# Patient Record
Sex: Female | Born: 1955
Health system: Southern US, Community
[De-identification: ages and names within clinical notes are randomized; demographics above are authoritative.]

## PROBLEM LIST (undated history)

## (undated) DIAGNOSIS — G629 Polyneuropathy, unspecified: Secondary | ICD-10-CM

## (undated) DIAGNOSIS — K859 Acute pancreatitis without necrosis or infection, unspecified: Secondary | ICD-10-CM

## (undated) DIAGNOSIS — J45909 Unspecified asthma, uncomplicated: Secondary | ICD-10-CM

## (undated) DIAGNOSIS — K635 Polyp of colon: Secondary | ICD-10-CM

## (undated) DIAGNOSIS — K219 Gastro-esophageal reflux disease without esophagitis: Secondary | ICD-10-CM

## (undated) DIAGNOSIS — I10 Essential (primary) hypertension: Secondary | ICD-10-CM

## (undated) DIAGNOSIS — K589 Irritable bowel syndrome without diarrhea: Secondary | ICD-10-CM

## (undated) DIAGNOSIS — E119 Type 2 diabetes mellitus without complications: Secondary | ICD-10-CM

## (undated) DIAGNOSIS — N390 Urinary tract infection, site not specified: Secondary | ICD-10-CM

## (undated) DIAGNOSIS — F419 Anxiety disorder, unspecified: Secondary | ICD-10-CM

## (undated) DIAGNOSIS — F329 Major depressive disorder, single episode, unspecified: Secondary | ICD-10-CM

## (undated) DIAGNOSIS — M1990 Unspecified osteoarthritis, unspecified site: Secondary | ICD-10-CM

## (undated) DIAGNOSIS — N200 Calculus of kidney: Secondary | ICD-10-CM

## (undated) DIAGNOSIS — M545 Low back pain, unspecified: Secondary | ICD-10-CM

## (undated) DIAGNOSIS — F32A Depression, unspecified: Secondary | ICD-10-CM

## (undated) DIAGNOSIS — E7800 Pure hypercholesterolemia, unspecified: Secondary | ICD-10-CM

## (undated) DIAGNOSIS — M797 Fibromyalgia: Secondary | ICD-10-CM

## (undated) DIAGNOSIS — F319 Bipolar disorder, unspecified: Secondary | ICD-10-CM

## (undated) DIAGNOSIS — E039 Hypothyroidism, unspecified: Secondary | ICD-10-CM

## (undated) HISTORY — DX: Unspecified osteoarthritis, unspecified site: M19.90

## (undated) HISTORY — DX: Calculus of kidney: N20.0

## (undated) HISTORY — DX: Depression, unspecified: F32.A

## (undated) HISTORY — DX: Fibromyalgia: M79.7

## (undated) HISTORY — PX: TOOTH EXTRACTION: SUR596

## (undated) HISTORY — DX: Low back pain: M54.5

## (undated) HISTORY — DX: Gastro-esophageal reflux disease without esophagitis: K21.9

## (undated) HISTORY — PX: ABDOMINAL HYSTERECTOMY: SHX81

## (undated) HISTORY — DX: Anxiety disorder, unspecified: F41.9

## (undated) HISTORY — DX: Hypothyroidism, unspecified: E03.9

## (undated) HISTORY — DX: Type 2 diabetes mellitus without complications: E11.9

## (undated) HISTORY — DX: Unspecified asthma, uncomplicated: J45.909

## (undated) HISTORY — DX: Low back pain, unspecified: M54.50

## (undated) HISTORY — DX: Major depressive disorder, single episode, unspecified: F32.9

## (undated) HISTORY — DX: Urinary tract infection, site not specified: N39.0

## (undated) HISTORY — DX: Polyp of colon: K63.5

## (undated) HISTORY — DX: Polyneuropathy, unspecified: G62.9

## (undated) HISTORY — DX: Irritable bowel syndrome without diarrhea: K58.9

## (undated) HISTORY — DX: Essential (primary) hypertension: I10

## (undated) HISTORY — DX: Bipolar disorder, unspecified: F31.9

## (undated) HISTORY — DX: Pure hypercholesterolemia, unspecified: E78.00

## (undated) HISTORY — DX: Acute pancreatitis without necrosis or infection, unspecified: K85.90

---

## 2007-04-07 HISTORY — PX: OTHER SURGICAL HISTORY: SHX169

## 2017-03-01 DIAGNOSIS — E119 Type 2 diabetes mellitus without complications: Secondary | ICD-10-CM | POA: Insufficient documentation

## 2017-03-01 DIAGNOSIS — R110 Nausea: Secondary | ICD-10-CM | POA: Insufficient documentation

## 2017-03-01 DIAGNOSIS — R1012 Left upper quadrant pain: Secondary | ICD-10-CM | POA: Insufficient documentation

## 2017-06-14 DIAGNOSIS — E782 Mixed hyperlipidemia: Secondary | ICD-10-CM | POA: Diagnosis not present

## 2017-06-14 DIAGNOSIS — E119 Type 2 diabetes mellitus without complications: Secondary | ICD-10-CM | POA: Diagnosis not present

## 2017-06-14 DIAGNOSIS — R300 Dysuria: Secondary | ICD-10-CM | POA: Diagnosis not present

## 2017-06-14 DIAGNOSIS — K219 Gastro-esophageal reflux disease without esophagitis: Secondary | ICD-10-CM | POA: Diagnosis not present

## 2017-06-14 DIAGNOSIS — R1084 Generalized abdominal pain: Secondary | ICD-10-CM | POA: Diagnosis not present

## 2017-06-28 DIAGNOSIS — K859 Acute pancreatitis without necrosis or infection, unspecified: Secondary | ICD-10-CM | POA: Insufficient documentation

## 2017-06-28 DIAGNOSIS — K861 Other chronic pancreatitis: Secondary | ICD-10-CM | POA: Diagnosis not present

## 2017-06-28 DIAGNOSIS — R1012 Left upper quadrant pain: Secondary | ICD-10-CM | POA: Diagnosis not present

## 2017-06-30 DIAGNOSIS — E1165 Type 2 diabetes mellitus with hyperglycemia: Secondary | ICD-10-CM | POA: Diagnosis not present

## 2017-06-30 DIAGNOSIS — I10 Essential (primary) hypertension: Secondary | ICD-10-CM | POA: Diagnosis not present

## 2017-06-30 DIAGNOSIS — F329 Major depressive disorder, single episode, unspecified: Secondary | ICD-10-CM | POA: Diagnosis not present

## 2017-06-30 DIAGNOSIS — E1140 Type 2 diabetes mellitus with diabetic neuropathy, unspecified: Secondary | ICD-10-CM | POA: Diagnosis not present

## 2017-07-09 DIAGNOSIS — J9801 Acute bronchospasm: Secondary | ICD-10-CM | POA: Diagnosis not present

## 2017-07-09 DIAGNOSIS — Z6835 Body mass index (BMI) 35.0-35.9, adult: Secondary | ICD-10-CM | POA: Diagnosis not present

## 2017-07-09 DIAGNOSIS — J209 Acute bronchitis, unspecified: Secondary | ICD-10-CM | POA: Diagnosis not present

## 2017-07-21 DIAGNOSIS — F329 Major depressive disorder, single episode, unspecified: Secondary | ICD-10-CM | POA: Diagnosis not present

## 2017-07-21 DIAGNOSIS — Z6835 Body mass index (BMI) 35.0-35.9, adult: Secondary | ICD-10-CM | POA: Diagnosis not present

## 2017-07-21 DIAGNOSIS — E1165 Type 2 diabetes mellitus with hyperglycemia: Secondary | ICD-10-CM | POA: Diagnosis not present

## 2017-07-21 DIAGNOSIS — E1140 Type 2 diabetes mellitus with diabetic neuropathy, unspecified: Secondary | ICD-10-CM | POA: Diagnosis not present

## 2017-07-28 DIAGNOSIS — H9201 Otalgia, right ear: Secondary | ICD-10-CM | POA: Diagnosis not present

## 2017-07-28 DIAGNOSIS — Z6834 Body mass index (BMI) 34.0-34.9, adult: Secondary | ICD-10-CM | POA: Diagnosis not present

## 2017-07-28 DIAGNOSIS — J209 Acute bronchitis, unspecified: Secondary | ICD-10-CM | POA: Diagnosis not present

## 2017-08-02 DIAGNOSIS — Z6835 Body mass index (BMI) 35.0-35.9, adult: Secondary | ICD-10-CM | POA: Diagnosis not present

## 2017-08-02 DIAGNOSIS — E1140 Type 2 diabetes mellitus with diabetic neuropathy, unspecified: Secondary | ICD-10-CM | POA: Diagnosis not present

## 2017-08-02 DIAGNOSIS — E1165 Type 2 diabetes mellitus with hyperglycemia: Secondary | ICD-10-CM | POA: Diagnosis not present

## 2017-08-12 DIAGNOSIS — Z6834 Body mass index (BMI) 34.0-34.9, adult: Secondary | ICD-10-CM | POA: Diagnosis not present

## 2017-08-12 DIAGNOSIS — Z7189 Other specified counseling: Secondary | ICD-10-CM | POA: Diagnosis not present

## 2017-08-12 DIAGNOSIS — R05 Cough: Secondary | ICD-10-CM | POA: Diagnosis not present

## 2017-09-03 DIAGNOSIS — R1110 Vomiting, unspecified: Secondary | ICD-10-CM | POA: Diagnosis not present

## 2017-09-03 DIAGNOSIS — R197 Diarrhea, unspecified: Secondary | ICD-10-CM | POA: Diagnosis not present

## 2017-09-03 DIAGNOSIS — Z86010 Personal history of colonic polyps: Secondary | ICD-10-CM | POA: Diagnosis not present

## 2017-09-03 DIAGNOSIS — K529 Noninfective gastroenteritis and colitis, unspecified: Secondary | ICD-10-CM | POA: Diagnosis not present

## 2017-10-01 DIAGNOSIS — Z13220 Encounter for screening for lipoid disorders: Secondary | ICD-10-CM | POA: Diagnosis not present

## 2017-10-01 DIAGNOSIS — Z6833 Body mass index (BMI) 33.0-33.9, adult: Secondary | ICD-10-CM | POA: Diagnosis not present

## 2017-10-01 DIAGNOSIS — E1140 Type 2 diabetes mellitus with diabetic neuropathy, unspecified: Secondary | ICD-10-CM | POA: Diagnosis not present

## 2017-10-01 DIAGNOSIS — I10 Essential (primary) hypertension: Secondary | ICD-10-CM | POA: Diagnosis not present

## 2017-10-01 DIAGNOSIS — K529 Noninfective gastroenteritis and colitis, unspecified: Secondary | ICD-10-CM | POA: Diagnosis not present

## 2017-10-01 DIAGNOSIS — E1165 Type 2 diabetes mellitus with hyperglycemia: Secondary | ICD-10-CM | POA: Diagnosis not present

## 2017-10-11 DIAGNOSIS — G629 Polyneuropathy, unspecified: Secondary | ICD-10-CM | POA: Diagnosis not present

## 2017-10-11 DIAGNOSIS — E1165 Type 2 diabetes mellitus with hyperglycemia: Secondary | ICD-10-CM | POA: Diagnosis not present

## 2017-10-11 DIAGNOSIS — R197 Diarrhea, unspecified: Secondary | ICD-10-CM | POA: Diagnosis not present

## 2017-10-11 DIAGNOSIS — E1140 Type 2 diabetes mellitus with diabetic neuropathy, unspecified: Secondary | ICD-10-CM | POA: Diagnosis not present

## 2017-10-18 DIAGNOSIS — R1012 Left upper quadrant pain: Secondary | ICD-10-CM | POA: Diagnosis not present

## 2017-10-18 DIAGNOSIS — R748 Abnormal levels of other serum enzymes: Secondary | ICD-10-CM | POA: Diagnosis not present

## 2017-10-18 DIAGNOSIS — R159 Full incontinence of feces: Secondary | ICD-10-CM | POA: Diagnosis not present

## 2017-10-18 DIAGNOSIS — K861 Other chronic pancreatitis: Secondary | ICD-10-CM | POA: Diagnosis not present

## 2017-10-18 DIAGNOSIS — R197 Diarrhea, unspecified: Secondary | ICD-10-CM | POA: Diagnosis not present

## 2017-10-18 DIAGNOSIS — K529 Noninfective gastroenteritis and colitis, unspecified: Secondary | ICD-10-CM | POA: Diagnosis not present

## 2017-10-19 ENCOUNTER — Encounter: Payer: Self-pay | Admitting: Podiatry

## 2017-10-19 ENCOUNTER — Encounter: Payer: Self-pay | Admitting: Family Medicine

## 2017-10-19 ENCOUNTER — Ambulatory Visit (INDEPENDENT_AMBULATORY_CARE_PROVIDER_SITE_OTHER): Payer: BLUE CROSS/BLUE SHIELD

## 2017-10-19 ENCOUNTER — Ambulatory Visit (INDEPENDENT_AMBULATORY_CARE_PROVIDER_SITE_OTHER): Payer: BLUE CROSS/BLUE SHIELD | Admitting: Podiatry

## 2017-10-19 VITALS — BP 106/62 | HR 85 | Temp 98.7°F | Resp 16 | Ht 64.0 in | Wt 188.0 lb

## 2017-10-19 DIAGNOSIS — M2142 Flat foot [pes planus] (acquired), left foot: Secondary | ICD-10-CM

## 2017-10-19 DIAGNOSIS — M2141 Flat foot [pes planus] (acquired), right foot: Secondary | ICD-10-CM

## 2017-10-19 DIAGNOSIS — G5763 Lesion of plantar nerve, bilateral lower limbs: Secondary | ICD-10-CM | POA: Diagnosis not present

## 2017-10-19 DIAGNOSIS — M722 Plantar fascial fibromatosis: Secondary | ICD-10-CM | POA: Diagnosis not present

## 2017-10-19 DIAGNOSIS — S92515A Nondisplaced fracture of proximal phalanx of left lesser toe(s), initial encounter for closed fracture: Secondary | ICD-10-CM | POA: Diagnosis not present

## 2017-10-19 NOTE — Progress Notes (Signed)
   Subjective:    Patient ID: Chelsea Mcbride, female    DOB: Apr 09, 1955, 62 y.o.   MRN: [id number]  HPI    Review of Systems  Musculoskeletal: Positive for arthralgias, joint swelling and myalgias.  All other systems reviewed and are negative.      Objective:   Physical Exam        Assessment & Plan:

## 2017-10-19 NOTE — Progress Notes (Signed)
  Subjective:  Patient ID: Chelsea Mcbride, female    DOB: [birth year],  MRN: [id number]  Chief Complaint  Patient presents with  . Foot Pain    B/L plantar forefoot (1-5th) x 6 mo; 10/10 sharp pain Tx; tylenol and ibuprofen -pt been dx w/ neuropathy x 1 year ago  . Foot Injury    L 4th toe (kicked door) x 1 mo; very painful red and swollen     62 y.o. female presents with the above complaint.  Reports pain to the balls of both feet for 6 months.  10 out of 10 sharp pain.  Has tried Tylenol and ibuprofen.  Diagnosed with neuropathy a year ago but is not getting treatment for.  Also reports injury to left fourth toe states that she kicked a door last month.  States that it is very painful and red and swollen. No past medical history on file.   Current Outpatient Medications:  .  atorvastatin (LIPITOR) 10 MG tablet, Take 10 mg by mouth daily., Disp: , Rfl:  .  citalopram (CELEXA) 40 MG tablet, Take 40 mg by mouth daily., Disp: , Rfl:  .  Dulaglutide (TRULICITY) 0.75 MG/0.5ML SOPN, Inject into the skin., Disp: , Rfl:  .  Eluxadoline (VIBERZI) 75 MG TABS, Take by mouth., Disp: , Rfl:  .  empagliflozin (JARDIANCE) 25 MG TABS tablet, Take 25 mg by mouth daily., Disp: , Rfl:  .  gabapentin (NEURONTIN) 100 MG capsule, Take 100 mg by mouth 3 (three) times daily., Disp: , Rfl:  .  lisinopril (PRINIVIL,ZESTRIL) 10 MG tablet, Take 10 mg by mouth daily., Disp: , Rfl:   Allergies  Allergen Reactions  . Penicillins Hives   Review of Systems: Negative except as noted in the HPI. Denies N/V/F/Ch. Objective:   Vitals:   10/19/17 0938  BP: 106/62  Pulse: 85  Resp: 16  Temp: 98.7 F (37.1 C)   General AA&O x3. Normal mood and affect.  Vascular Dorsalis pedis and posterior tibial pulses  present 2+ bilaterally  Capillary refill normal to all digits. Pedal hair growth normal.  Neurologic Epicritic sensation grossly present.  Dermatologic No open lesions. Interspaces clear of maceration. Nails  well groomed and normal in appearance.  Orthopedic: MMT 5/5 in dorsiflexion, plantarflexion, inversion, and eversion. Normal joint ROM without pain or crepitus. About the left fourth proximal phalanx.  Edema noted to the digit.  Slight contusion.  Pain palpation about the third interspace bilateral with Mulder's click.  Pain palpation dorsally and plantarly of the interspace.   Assessment & Plan:  Patient was evaluated and treated and all questions answered.  Morton Neuroma bilat -Pads dispensed. -Injection bilat as below. -Casted for CMOS  Procedure: Neuroma Injection Location: Bilateral 3rd interspace Skin Prep: Alcohol. Injectate: 0.5 cc 0.5% marcaine plain, 0.5 cc dexamethasone phosphate. Disposition: Patient tolerated procedure well. Injection site dressed with a band-aid.  L 4th Toe Fracture -XR taken. Lucency across proximal phalanx base suspicious for fx -Buddy taped to the 3rd toe. Educated on taping.  Return in about 3 weeks (around 11/09/2017).

## 2017-10-20 ENCOUNTER — Telehealth: Payer: Self-pay | Admitting: Podiatry

## 2017-10-20 NOTE — Telephone Encounter (Signed)
I saw Dr. Samuella CotaPrice yesterday and he gave me some injections in my feet because of a nerve that was really bothering me. They are still kind of stinging and stuff today. I wondered if they should have been calmed down by now? If I could get a call back at [phone]. Thank you.

## 2017-10-20 NOTE — Telephone Encounter (Signed)
I told pt it may take a series of injections to get the nerve calmed down or lysed depending on the injection and on occasions there may be a flare to the nerves, which she could treat with ice therapy 15 minute/session 3-4 times daily protecting the skin with a light cloth. Pt states understanding.

## 2017-10-22 DIAGNOSIS — G4733 Obstructive sleep apnea (adult) (pediatric): Secondary | ICD-10-CM | POA: Diagnosis not present

## 2017-10-22 DIAGNOSIS — I10 Essential (primary) hypertension: Secondary | ICD-10-CM | POA: Diagnosis not present

## 2017-10-22 DIAGNOSIS — E1165 Type 2 diabetes mellitus with hyperglycemia: Secondary | ICD-10-CM | POA: Diagnosis not present

## 2017-10-22 DIAGNOSIS — E1140 Type 2 diabetes mellitus with diabetic neuropathy, unspecified: Secondary | ICD-10-CM | POA: Diagnosis not present

## 2017-10-27 ENCOUNTER — Other Ambulatory Visit: Payer: Self-pay | Admitting: Podiatry

## 2017-10-27 DIAGNOSIS — S92515A Nondisplaced fracture of proximal phalanx of left lesser toe(s), initial encounter for closed fracture: Secondary | ICD-10-CM

## 2017-10-27 DIAGNOSIS — G5763 Lesion of plantar nerve, bilateral lower limbs: Secondary | ICD-10-CM

## 2017-10-27 DIAGNOSIS — M2142 Flat foot [pes planus] (acquired), left foot: Secondary | ICD-10-CM

## 2017-10-27 DIAGNOSIS — M722 Plantar fascial fibromatosis: Secondary | ICD-10-CM

## 2017-10-27 DIAGNOSIS — M2141 Flat foot [pes planus] (acquired), right foot: Secondary | ICD-10-CM

## 2017-11-03 DIAGNOSIS — Z6833 Body mass index (BMI) 33.0-33.9, adult: Secondary | ICD-10-CM | POA: Diagnosis not present

## 2017-11-03 DIAGNOSIS — E669 Obesity, unspecified: Secondary | ICD-10-CM | POA: Diagnosis not present

## 2017-11-03 DIAGNOSIS — S46912A Strain of unspecified muscle, fascia and tendon at shoulder and upper arm level, left arm, initial encounter: Secondary | ICD-10-CM | POA: Diagnosis not present

## 2017-11-05 DIAGNOSIS — R221 Localized swelling, mass and lump, neck: Secondary | ICD-10-CM | POA: Diagnosis not present

## 2017-11-05 DIAGNOSIS — R0981 Nasal congestion: Secondary | ICD-10-CM | POA: Diagnosis not present

## 2017-11-05 DIAGNOSIS — Z8781 Personal history of (healed) traumatic fracture: Secondary | ICD-10-CM | POA: Diagnosis not present

## 2017-11-09 ENCOUNTER — Ambulatory Visit (INDEPENDENT_AMBULATORY_CARE_PROVIDER_SITE_OTHER): Payer: BLUE CROSS/BLUE SHIELD | Admitting: Podiatry

## 2017-11-09 DIAGNOSIS — G5763 Lesion of plantar nerve, bilateral lower limbs: Secondary | ICD-10-CM

## 2017-11-09 NOTE — Progress Notes (Signed)
  Subjective:  Patient ID: Chelsea Mcbride, female    DOB: 04/21/1955,  MRN: [id number]  Chief Complaint  Patient presents with  . Neuroma    F/U B/L neuroma Pt.stated," no improvement, it's still sore and swollen; 5/10." Tx: tapping    62 y.o. female presents with the above complaint.  Injection helped but pain returned. Pads have been helping.  Also taping the toes as directed and states they help.  No past medical history on file.   Current Outpatient Medications:  .  atorvastatin (LIPITOR) 10 MG tablet, Take 10 mg by mouth daily., Disp: , Rfl:  .  citalopram (CELEXA) 40 MG tablet, Take 40 mg by mouth daily., Disp: , Rfl:  .  Dulaglutide (TRULICITY) 0.75 MG/0.5ML SOPN, Inject into the skin., Disp: , Rfl:  .  Eluxadoline (VIBERZI) 75 MG TABS, Take by mouth., Disp: , Rfl:  .  empagliflozin (JARDIANCE) 25 MG TABS tablet, Take 25 mg by mouth daily., Disp: , Rfl:  .  gabapentin (NEURONTIN) 100 MG capsule, Take 100 mg by mouth 3 (three) times daily., Disp: , Rfl:  .  lisinopril (PRINIVIL,ZESTRIL) 10 MG tablet, Take 10 mg by mouth daily., Disp: , Rfl:   Allergies  Allergen Reactions  . Penicillins Hives   Review of Systems: Negative except as noted in the HPI. Denies N/V/F/Ch. Objective:   There were no vitals filed for this visit. General AA&O x3. Normal mood and affect.  Vascular Dorsalis pedis and posterior tibial pulses  present 2+ bilaterally  Capillary refill normal to all digits. Pedal hair growth normal.  Neurologic Epicritic sensation grossly present.  Dermatologic No open lesions. Interspaces clear of maceration. Nails well groomed and normal in appearance.  Orthopedic: MMT 5/5 in dorsiflexion, plantarflexion, inversion, and eversion. Normal joint ROM without pain or crepitus. POP about the left fourth proximal phalanx.   Pain palpation about the third interspace bilateral with Mulder's click.  Pain palpation dorsally and plantarly of the interspace.   Assessment &  Plan:  Patient was evaluated and treated and all questions answered.  Morton Neuroma bilat -Pads dispensed. -Injection #2 bilat as below. -CMO molds sent for fabrication.  Procedure: Neuroma Injection Location: Bilateral 3rd interspace Skin Prep: Alcohol. Injectate: 0.5 cc 0.5% marcaine plain, 0.5 cc dexamethasone phosphate. Disposition: Patient tolerated procedure well. Injection site dressed with a band-aid.  L 4th Toe Fracture -Continue taping.  No follow-ups on file.

## 2017-11-15 ENCOUNTER — Ambulatory Visit (INDEPENDENT_AMBULATORY_CARE_PROVIDER_SITE_OTHER): Payer: Self-pay | Admitting: Podiatry

## 2017-11-15 ENCOUNTER — Ambulatory Visit (INDEPENDENT_AMBULATORY_CARE_PROVIDER_SITE_OTHER): Payer: BLUE CROSS/BLUE SHIELD

## 2017-11-15 DIAGNOSIS — G629 Polyneuropathy, unspecified: Secondary | ICD-10-CM

## 2017-11-15 DIAGNOSIS — M7752 Other enthesopathy of left foot: Secondary | ICD-10-CM

## 2017-11-15 DIAGNOSIS — M779 Enthesopathy, unspecified: Secondary | ICD-10-CM

## 2017-11-15 DIAGNOSIS — E1142 Type 2 diabetes mellitus with diabetic polyneuropathy: Secondary | ICD-10-CM | POA: Diagnosis not present

## 2017-11-15 DIAGNOSIS — S92515A Nondisplaced fracture of proximal phalanx of left lesser toe(s), initial encounter for closed fracture: Secondary | ICD-10-CM

## 2017-11-15 DIAGNOSIS — M7751 Other enthesopathy of right foot: Secondary | ICD-10-CM | POA: Diagnosis not present

## 2017-11-15 DIAGNOSIS — M792 Neuralgia and neuritis, unspecified: Secondary | ICD-10-CM

## 2017-11-15 MED ORDER — MELOXICAM 15 MG PO TABS: 15.0000 mg | ORAL_TABLET | Freq: Every day | ORAL | 0 refills | Status: DC

## 2017-11-15 MED ORDER — GABAPENTIN 300 MG PO CAPS: 300.0000 mg | ORAL_CAPSULE | Freq: Every day | ORAL | 0 refills | Status: AC

## 2017-11-15 NOTE — Progress Notes (Signed)
  Subjective:  Patient ID: Chelsea Mcbride, female    DOB: Mar 31, 1956,  MRN: [id number]  Chief Complaint  Patient presents with  . Foot Injury    F/U L 4th toe fx Pt. stated," it's not doing any better, I dropped another box on my toe and now it's throbbing, also my L 3rd toe has a knot."   . Foot Pain    R foot lateral side foot pain and a knot x 2 days; 10/10 sharp pain -no injury Tx: Ibuprofen    62 y.o. female presents with the above complaint.  Injection helped but pain returned. Pads have been helping.  Also taping the toes as directed and states they help.  No past medical history on file.   Current Outpatient Medications:  .  atorvastatin (LIPITOR) 10 MG tablet, Take 10 mg by mouth daily., Disp: , Rfl:  .  citalopram (CELEXA) 40 MG tablet, Take 40 mg by mouth daily., Disp: , Rfl:  .  Dulaglutide (TRULICITY) 0.75 MG/0.5ML SOPN, Inject into the skin., Disp: , Rfl:  .  Eluxadoline (VIBERZI) 75 MG TABS, Take by mouth., Disp: , Rfl:  .  empagliflozin (JARDIANCE) 25 MG TABS tablet, Take 25 mg by mouth daily., Disp: , Rfl:  .  gabapentin (NEURONTIN) 100 MG capsule, Take 100 mg by mouth 3 (three) times daily., Disp: , Rfl:  .  lisinopril (PRINIVIL,ZESTRIL) 10 MG tablet, Take 10 mg by mouth daily., Disp: , Rfl:  .  gabapentin (NEURONTIN) 300 MG capsule, Take 1 capsule (300 mg total) by mouth at bedtime., Disp: 30 capsule, Rfl: 0 .  meloxicam (MOBIC) 15 MG tablet, Take 1 tablet (15 mg total) by mouth daily., Disp: 30 tablet, Rfl: 0  Allergies  Allergen Reactions  . Penicillins Hives   Review of Systems: Negative except as noted in the HPI. Denies N/V/F/Ch. Objective:   There were no vitals filed for this visit. General AA&O x3. Normal mood and affect.  Vascular Dorsalis pedis and posterior tibial pulses  present 2+ bilaterally  Capillary refill normal to all digits. Pedal hair growth normal.  Neurologic Epicritic sensation grossly present.  Dermatologic No open  lesions. Interspaces clear of maceration. Nails well groomed and normal in appearance.  Orthopedic: MMT 5/5 in dorsiflexion, plantarflexion, inversion, and eversion. Normal joint ROM without pain or crepitus. POP about the left fourth proximal phalanx.   Pain palpation about the left third proximal phalanx Pain palpation about the third interspace bilateral with Mulder's click.  Pain palpation dorsally and plantarly of the interspace.   Assessment & Plan:  Patient was evaluated and treated and all questions answered.  L 4th Toe Fracture -Continue taping.  L 3rd Toe Fracture -X-rays taken reviewed slight cortical irregularity seen on the oblique view.  Continue taping.  No follow-ups on file.

## 2017-11-22 ENCOUNTER — Other Ambulatory Visit: Payer: Self-pay | Admitting: Podiatry

## 2017-11-22 DIAGNOSIS — M779 Enthesopathy, unspecified: Secondary | ICD-10-CM

## 2017-11-22 DIAGNOSIS — G629 Polyneuropathy, unspecified: Secondary | ICD-10-CM

## 2017-11-22 DIAGNOSIS — M792 Neuralgia and neuritis, unspecified: Secondary | ICD-10-CM

## 2017-11-22 DIAGNOSIS — S92515A Nondisplaced fracture of proximal phalanx of left lesser toe(s), initial encounter for closed fracture: Secondary | ICD-10-CM

## 2017-12-07 ENCOUNTER — Ambulatory Visit (INDEPENDENT_AMBULATORY_CARE_PROVIDER_SITE_OTHER): Payer: BLUE CROSS/BLUE SHIELD | Admitting: Podiatry

## 2017-12-07 ENCOUNTER — Ambulatory Visit (INDEPENDENT_AMBULATORY_CARE_PROVIDER_SITE_OTHER): Payer: BLUE CROSS/BLUE SHIELD

## 2017-12-07 DIAGNOSIS — M779 Enthesopathy, unspecified: Secondary | ICD-10-CM

## 2017-12-07 DIAGNOSIS — S92515A Nondisplaced fracture of proximal phalanx of left lesser toe(s), initial encounter for closed fracture: Secondary | ICD-10-CM | POA: Diagnosis not present

## 2017-12-07 DIAGNOSIS — M205X9 Other deformities of toe(s) (acquired), unspecified foot: Secondary | ICD-10-CM | POA: Diagnosis not present

## 2017-12-07 DIAGNOSIS — M2042 Other hammer toe(s) (acquired), left foot: Secondary | ICD-10-CM | POA: Diagnosis not present

## 2017-12-07 DIAGNOSIS — M792 Neuralgia and neuritis, unspecified: Secondary | ICD-10-CM | POA: Diagnosis not present

## 2017-12-07 DIAGNOSIS — G5763 Lesion of plantar nerve, bilateral lower limbs: Secondary | ICD-10-CM

## 2017-12-07 NOTE — Progress Notes (Signed)
  Subjective:  Patient ID: Chelsea Mcbride, female    DOB: [birth year],  MRN: [id number]  Chief Complaint  Patient presents with  . Foot Injury    F/U L 3rd-4th toes fx Pt. stated," pain is about the same, no change at all." Tx: taping, and icing    62 y.o. female presents with the above complaint. States pain is about the same. Taping the toes as directed.  Review of Systems: Negative except as noted in the HPI. Denies N/V/F/Ch.  No past medical history on file.  Current Outpatient Medications:  .  atorvastatin (LIPITOR) 10 MG tablet, Take 10 mg by mouth daily., Disp: , Rfl:  .  citalopram (CELEXA) 40 MG tablet, Take 40 mg by mouth daily., Disp: , Rfl:  .  Dulaglutide (TRULICITY) 0.75 MG/0.5ML SOPN, Inject into the skin., Disp: , Rfl:  .  Eluxadoline (VIBERZI) 75 MG TABS, Take by mouth., Disp: , Rfl:  .  empagliflozin (JARDIANCE) 25 MG TABS tablet, Take 25 mg by mouth daily., Disp: , Rfl:  .  gabapentin (NEURONTIN) 100 MG capsule, Take 100 mg by mouth 3 (three) times daily., Disp: , Rfl:  .  gabapentin (NEURONTIN) 300 MG capsule, Take 1 capsule (300 mg total) by mouth at bedtime., Disp: 30 capsule, Rfl: 0 .  lisinopril (PRINIVIL,ZESTRIL) 10 MG tablet, Take 10 mg by mouth daily., Disp: , Rfl:  .  meloxicam (MOBIC) 15 MG tablet, Take 1 tablet (15 mg total) by mouth daily., Disp: 30 tablet, Rfl: 0  Social History   Tobacco Use  Smoking Status Never Smoker  Smokeless Tobacco Never Used    Allergies  Allergen Reactions  . Penicillins Hives   Objective:  There were no vitals filed for this visit. There is no height or weight on file to calculate BMI. Constitutional Well developed. Well nourished.  Vascular Dorsalis pedis pulses palpable bilaterally. Posterior tibial pulses palpable bilaterally. Capillary refill normal to all digits.  No cyanosis or clubbing noted. Pedal hair growth normal.  Neurologic Normal speech. Oriented to person, place, and time. Epicritic sensation to  light touch grossly present bilaterally.  Dermatologic Nails well groomed and normal in appearance. No open wounds. No skin lesions.  Orthopedic: Normal joint ROM without pain or crepitus bilaterally. No visible deformities. No bony tenderness.   Radiographs: Taken and reviewed. Still slight cortical irregularity. Assessment:   1. Closed nondisplaced fracture of proximal phalanx of lesser toe of left foot, initial encounter   2. Neuralgia and neuritis   3. Capsulitis   4. Morton's metatarsalgia, neuralgia, or neuroma, bilateral   5. Hammer toe of left foot   6. Contracture of toe joint    Plan:  Patient was evaluated and treated and all questions answered.  Fracture L 3rd/4th Proximal Phalanx -Continue Taping  Hammertoes L Foot -Should pain persist would consider hammertoe correction.  -Discussed shoegear to prevent pain and shoe irritation.  Return if symptoms worsen or fail to improve.

## 2017-12-10 ENCOUNTER — Other Ambulatory Visit: Payer: Self-pay | Admitting: Podiatry

## 2017-12-10 DIAGNOSIS — G4733 Obstructive sleep apnea (adult) (pediatric): Secondary | ICD-10-CM | POA: Diagnosis not present

## 2017-12-10 DIAGNOSIS — M792 Neuralgia and neuritis, unspecified: Secondary | ICD-10-CM

## 2017-12-10 DIAGNOSIS — E1165 Type 2 diabetes mellitus with hyperglycemia: Secondary | ICD-10-CM | POA: Diagnosis not present

## 2017-12-10 DIAGNOSIS — M779 Enthesopathy, unspecified: Secondary | ICD-10-CM

## 2017-12-10 DIAGNOSIS — R5383 Other fatigue: Secondary | ICD-10-CM | POA: Diagnosis not present

## 2017-12-10 DIAGNOSIS — Z23 Encounter for immunization: Secondary | ICD-10-CM | POA: Diagnosis not present

## 2017-12-10 DIAGNOSIS — E1140 Type 2 diabetes mellitus with diabetic neuropathy, unspecified: Secondary | ICD-10-CM | POA: Diagnosis not present

## 2017-12-10 DIAGNOSIS — S92515A Nondisplaced fracture of proximal phalanx of left lesser toe(s), initial encounter for closed fracture: Secondary | ICD-10-CM

## 2017-12-13 ENCOUNTER — Encounter: Payer: Self-pay | Admitting: Gastroenterology

## 2017-12-13 DIAGNOSIS — Z7984 Long term (current) use of oral hypoglycemic drugs: Secondary | ICD-10-CM | POA: Diagnosis not present

## 2017-12-13 DIAGNOSIS — E1142 Type 2 diabetes mellitus with diabetic polyneuropathy: Secondary | ICD-10-CM | POA: Diagnosis not present

## 2017-12-13 DIAGNOSIS — R197 Diarrhea, unspecified: Secondary | ICD-10-CM | POA: Diagnosis not present

## 2017-12-13 DIAGNOSIS — R1012 Left upper quadrant pain: Secondary | ICD-10-CM | POA: Diagnosis not present

## 2017-12-13 DIAGNOSIS — R159 Full incontinence of feces: Secondary | ICD-10-CM | POA: Diagnosis not present

## 2017-12-13 DIAGNOSIS — Z86010 Personal history of colonic polyps: Secondary | ICD-10-CM | POA: Diagnosis not present

## 2017-12-13 DIAGNOSIS — R1084 Generalized abdominal pain: Secondary | ICD-10-CM | POA: Diagnosis not present

## 2017-12-13 DIAGNOSIS — K8020 Calculus of gallbladder without cholecystitis without obstruction: Secondary | ICD-10-CM | POA: Diagnosis not present

## 2017-12-13 DIAGNOSIS — R110 Nausea: Secondary | ICD-10-CM | POA: Diagnosis not present

## 2017-12-13 DIAGNOSIS — K529 Noninfective gastroenteritis and colitis, unspecified: Secondary | ICD-10-CM | POA: Diagnosis not present

## 2017-12-23 DIAGNOSIS — K529 Noninfective gastroenteritis and colitis, unspecified: Secondary | ICD-10-CM | POA: Diagnosis not present

## 2017-12-23 DIAGNOSIS — Z139 Encounter for screening, unspecified: Secondary | ICD-10-CM | POA: Diagnosis not present

## 2017-12-23 DIAGNOSIS — Z6834 Body mass index (BMI) 34.0-34.9, adult: Secondary | ICD-10-CM | POA: Diagnosis not present

## 2017-12-28 DIAGNOSIS — K529 Noninfective gastroenteritis and colitis, unspecified: Secondary | ICD-10-CM | POA: Diagnosis not present

## 2017-12-30 ENCOUNTER — Other Ambulatory Visit: Payer: Self-pay | Admitting: Podiatry

## 2018-01-04 HISTORY — PX: COLONOSCOPY WITH ESOPHAGOGASTRODUODENOSCOPY (EGD): SHX5779

## 2018-01-11 DIAGNOSIS — Z6833 Body mass index (BMI) 33.0-33.9, adult: Secondary | ICD-10-CM | POA: Diagnosis not present

## 2018-01-11 DIAGNOSIS — E669 Obesity, unspecified: Secondary | ICD-10-CM | POA: Diagnosis not present

## 2018-01-11 DIAGNOSIS — M62838 Other muscle spasm: Secondary | ICD-10-CM | POA: Diagnosis not present

## 2018-01-11 DIAGNOSIS — J0100 Acute maxillary sinusitis, unspecified: Secondary | ICD-10-CM | POA: Diagnosis not present

## 2018-01-13 ENCOUNTER — Telehealth: Payer: Self-pay | Admitting: *Deleted

## 2018-01-13 NOTE — Telephone Encounter (Signed)
Left message for pt to call and schedule an appointment to pick up her orthotics

## 2018-01-18 ENCOUNTER — Encounter: Payer: Self-pay | Admitting: *Deleted

## 2018-01-18 NOTE — Progress Notes (Signed)
Patient ID: Chelsea Mcbride, female   DOB: 09-29-1955, 62 y.o.   MRN: [id number]  Patient presents for orthotic pick up.  Verbal break in and wear instructions given.  Patient currently wears orthotics.  Patient will follow up with Dr. Samuella Cota in 4 weeks if symptoms worsen or fail to improve.

## 2018-01-26 ENCOUNTER — Other Ambulatory Visit: Payer: Self-pay | Admitting: Podiatry

## 2018-02-04 DIAGNOSIS — I10 Essential (primary) hypertension: Secondary | ICD-10-CM | POA: Diagnosis not present

## 2018-02-04 DIAGNOSIS — E782 Mixed hyperlipidemia: Secondary | ICD-10-CM | POA: Diagnosis not present

## 2018-02-04 DIAGNOSIS — E1140 Type 2 diabetes mellitus with diabetic neuropathy, unspecified: Secondary | ICD-10-CM | POA: Diagnosis not present

## 2018-02-04 DIAGNOSIS — R110 Nausea: Secondary | ICD-10-CM | POA: Diagnosis not present

## 2018-02-04 DIAGNOSIS — Z139 Encounter for screening, unspecified: Secondary | ICD-10-CM | POA: Diagnosis not present

## 2018-02-04 DIAGNOSIS — K8020 Calculus of gallbladder without cholecystitis without obstruction: Secondary | ICD-10-CM | POA: Diagnosis not present

## 2018-02-07 ENCOUNTER — Ambulatory Visit (INDEPENDENT_AMBULATORY_CARE_PROVIDER_SITE_OTHER): Payer: BLUE CROSS/BLUE SHIELD | Admitting: Podiatry

## 2018-02-07 ENCOUNTER — Ambulatory Visit (INDEPENDENT_AMBULATORY_CARE_PROVIDER_SITE_OTHER): Payer: BLUE CROSS/BLUE SHIELD

## 2018-02-07 ENCOUNTER — Other Ambulatory Visit: Payer: Self-pay | Admitting: Podiatry

## 2018-02-07 DIAGNOSIS — M79671 Pain in right foot: Secondary | ICD-10-CM

## 2018-02-07 DIAGNOSIS — S92515A Nondisplaced fracture of proximal phalanx of left lesser toe(s), initial encounter for closed fracture: Secondary | ICD-10-CM

## 2018-02-07 DIAGNOSIS — E669 Obesity, unspecified: Secondary | ICD-10-CM | POA: Diagnosis not present

## 2018-02-07 DIAGNOSIS — K861 Other chronic pancreatitis: Secondary | ICD-10-CM | POA: Diagnosis not present

## 2018-02-07 DIAGNOSIS — M79672 Pain in left foot: Secondary | ICD-10-CM

## 2018-02-07 DIAGNOSIS — Z6833 Body mass index (BMI) 33.0-33.9, adult: Secondary | ICD-10-CM | POA: Diagnosis not present

## 2018-02-07 DIAGNOSIS — S99921A Unspecified injury of right foot, initial encounter: Secondary | ICD-10-CM | POA: Diagnosis not present

## 2018-02-07 DIAGNOSIS — L600 Ingrowing nail: Secondary | ICD-10-CM | POA: Diagnosis not present

## 2018-02-07 DIAGNOSIS — G43809 Other migraine, not intractable, without status migrainosus: Secondary | ICD-10-CM | POA: Diagnosis not present

## 2018-02-07 MED ORDER — NEOMYCIN-POLYMYXIN-HC 3.5-10000-1 OT SOLN: OTIC | 0 refills | Status: DC

## 2018-02-07 NOTE — Patient Instructions (Signed)

## 2018-02-07 NOTE — Progress Notes (Signed)
Subjective:  Patient ID: Chelsea Mcbride, female    DOB: 12-Sep-1955,  MRN: [id number]  No chief complaint on file.   62 y.o. female presents for concern of injury to the right second toe.  States that she stumped the toe last week.  Also dropped a box on her left fourth toe on Friday.  Concerned she has an ingrown nail in the right second toe   Review of Systems: Negative except as noted in the HPI. Denies N/V/F/Ch.  No past medical history on file.  Current Outpatient Medications:  .  atorvastatin (LIPITOR) 10 MG tablet, Take 10 mg by mouth daily., Disp: , Rfl:  .  citalopram (CELEXA) 40 MG tablet, Take 40 mg by mouth daily., Disp: , Rfl:  .  Dulaglutide (TRULICITY) 0.75 MG/0.5ML SOPN, Inject into the skin., Disp: , Rfl:  .  Eluxadoline (VIBERZI) 75 MG TABS, Take by mouth., Disp: , Rfl:  .  empagliflozin (JARDIANCE) 25 MG TABS tablet, Take 25 mg by mouth daily., Disp: , Rfl:  .  gabapentin (NEURONTIN) 100 MG capsule, Take 100 mg by mouth 3 (three) times daily., Disp: , Rfl:  .  gabapentin (NEURONTIN) 300 MG capsule, Take 1 capsule (300 mg total) by mouth at bedtime., Disp: 30 capsule, Rfl: 0 .  lisinopril (PRINIVIL,ZESTRIL) 10 MG tablet, Take 10 mg by mouth daily., Disp: , Rfl:  .  meloxicam (MOBIC) 15 MG tablet, TAKE 1 TABLET BY MOUTH ONCE DAILY, Disp: 30 tablet, Rfl: 0 .  neomycin-polymyxin-hydrocortisone (CORTISPORIN) OTIC solution, Apply 2-3 drops to the ingrown toenail site twice daily. Cover with band-aid., Disp: 10 mL, Rfl: 0  Social History   Tobacco Use  Smoking Status Never Smoker  Smokeless Tobacco Never Used    Allergies  Allergen Reactions  . Penicillins Hives   Objective:  There were no vitals filed for this visit. There is no height or weight on file to calculate BMI. Constitutional Well developed. Well nourished.  Vascular Dorsalis pedis pulses palpable bilaterally. Posterior tibial pulses palpable bilaterally. Capillary refill normal to all digits.  No  cyanosis or clubbing noted. Pedal hair growth normal.  Neurologic Normal speech. Oriented to person, place, and time. Epicritic sensation to light touch grossly present bilaterally.  Dermatologic Painful ingrowing nail at lateral nail borders of the second toenail right. No other open wounds. No skin lesions.  Orthopedic: Normal joint ROM without pain or crepitus bilaterally. No visible deformities. Pain palpation about the right fifth toe Small contusion left fourth toe   Radiographs: X-rays taken reviewed no new fracture noted Assessment:   1. Ingrown nail   2. Closed nondisplaced fracture of proximal phalanx of lesser toe of left foot, initial encounter   3. Injury of second toe, right, initial encounter   4. Bilateral foot pain   5. Injury of toe, right, initial encounter    Plan:  Patient was evaluated and treated and all questions answered.  Ingrown Nail, right -Patient elects to proceed with minor surgery to remove ingrown toenail removal today. Consent reviewed and signed by patient. -Ingrown nail excised. See procedure note. -Educated on post-procedure care including soaking. Written instructions provided and reviewed. -Patient to follow up in 2 weeks for nail check.  Procedure: Excision of Ingrown Toenail Location: Right 2nd toe lateral nail borders. Anesthesia: Lidocaine 1% plain; 1.5 mL and Marcaine 0.5% plain; 1.5 mL, digital block. Skin Prep: Betadine. Dressing: Silvadene; telfa; dry, sterile, compression dressing. Technique: Following skin prep, the toe was exsanguinated and a tourniquet was secured at  the base of the toe. The affected nail border was freed, split with a nail splitter, and excised. Chemical matrixectomy was then performed with phenol and irrigated out with alcohol. The tourniquet was then removed and sterile dressing applied. Disposition: Patient tolerated procedure well. Patient to return in 2 weeks for follow-up.   Contusion right second  toe -No evidence of fracture on x-ray  Left foot fifth toe fracture -Fracture line evident no new fractures   Return in about 2 weeks (around 02/21/2018) for Nail Check, Right 2nd toe.

## 2018-02-21 ENCOUNTER — Ambulatory Visit: Payer: BLUE CROSS/BLUE SHIELD | Admitting: Podiatry

## 2018-02-22 DIAGNOSIS — G43809 Other migraine, not intractable, without status migrainosus: Secondary | ICD-10-CM | POA: Diagnosis not present

## 2018-02-22 DIAGNOSIS — M542 Cervicalgia: Secondary | ICD-10-CM | POA: Diagnosis not present

## 2018-02-22 DIAGNOSIS — M545 Low back pain: Secondary | ICD-10-CM | POA: Diagnosis not present

## 2018-02-25 DIAGNOSIS — K8020 Calculus of gallbladder without cholecystitis without obstruction: Secondary | ICD-10-CM | POA: Diagnosis not present

## 2018-02-25 DIAGNOSIS — Z6833 Body mass index (BMI) 33.0-33.9, adult: Secondary | ICD-10-CM | POA: Diagnosis not present

## 2018-02-28 ENCOUNTER — Ambulatory Visit: Payer: BLUE CROSS/BLUE SHIELD | Admitting: Podiatry

## 2018-03-07 DIAGNOSIS — M542 Cervicalgia: Secondary | ICD-10-CM | POA: Diagnosis not present

## 2018-03-07 DIAGNOSIS — M545 Low back pain: Secondary | ICD-10-CM | POA: Diagnosis not present

## 2018-03-07 DIAGNOSIS — G43809 Other migraine, not intractable, without status migrainosus: Secondary | ICD-10-CM | POA: Diagnosis not present

## 2018-03-09 DIAGNOSIS — E1165 Type 2 diabetes mellitus with hyperglycemia: Secondary | ICD-10-CM | POA: Diagnosis not present

## 2018-03-09 DIAGNOSIS — E1140 Type 2 diabetes mellitus with diabetic neuropathy, unspecified: Secondary | ICD-10-CM | POA: Diagnosis not present

## 2018-03-09 DIAGNOSIS — B373 Candidiasis of vulva and vagina: Secondary | ICD-10-CM | POA: Diagnosis not present

## 2018-03-09 DIAGNOSIS — M47812 Spondylosis without myelopathy or radiculopathy, cervical region: Secondary | ICD-10-CM | POA: Diagnosis not present

## 2018-03-09 DIAGNOSIS — K529 Noninfective gastroenteritis and colitis, unspecified: Secondary | ICD-10-CM | POA: Diagnosis not present

## 2018-03-09 DIAGNOSIS — G43809 Other migraine, not intractable, without status migrainosus: Secondary | ICD-10-CM | POA: Diagnosis not present

## 2018-03-09 DIAGNOSIS — R300 Dysuria: Secondary | ICD-10-CM | POA: Diagnosis not present

## 2018-03-09 DIAGNOSIS — M4802 Spinal stenosis, cervical region: Secondary | ICD-10-CM | POA: Diagnosis not present

## 2018-03-14 ENCOUNTER — Encounter: Payer: Self-pay | Admitting: Gastroenterology

## 2018-03-14 DIAGNOSIS — E785 Hyperlipidemia, unspecified: Secondary | ICD-10-CM | POA: Diagnosis not present

## 2018-03-14 DIAGNOSIS — Y838 Other surgical procedures as the cause of abnormal reaction of the patient, or of later complication, without mention of misadventure at the time of the procedure: Secondary | ICD-10-CM | POA: Diagnosis not present

## 2018-03-14 DIAGNOSIS — Z79899 Other long term (current) drug therapy: Secondary | ICD-10-CM | POA: Diagnosis not present

## 2018-03-14 DIAGNOSIS — I10 Essential (primary) hypertension: Secondary | ICD-10-CM | POA: Diagnosis not present

## 2018-03-14 DIAGNOSIS — K760 Fatty (change of) liver, not elsewhere classified: Secondary | ICD-10-CM | POA: Diagnosis not present

## 2018-03-14 DIAGNOSIS — M1990 Unspecified osteoarthritis, unspecified site: Secondary | ICD-10-CM | POA: Diagnosis not present

## 2018-03-14 DIAGNOSIS — E119 Type 2 diabetes mellitus without complications: Secondary | ICD-10-CM | POA: Diagnosis not present

## 2018-03-14 DIAGNOSIS — E669 Obesity, unspecified: Secondary | ICD-10-CM | POA: Diagnosis not present

## 2018-03-14 DIAGNOSIS — K7460 Unspecified cirrhosis of liver: Secondary | ICD-10-CM | POA: Diagnosis not present

## 2018-03-14 DIAGNOSIS — Y813 Surgical instruments, materials and general- and plastic-surgery devices (including sutures) associated with adverse incidents: Secondary | ICD-10-CM | POA: Diagnosis not present

## 2018-03-14 DIAGNOSIS — Z794 Long term (current) use of insulin: Secondary | ICD-10-CM | POA: Diagnosis not present

## 2018-03-14 DIAGNOSIS — K9171 Accidental puncture and laceration of a digestive system organ or structure during a digestive system procedure: Secondary | ICD-10-CM | POA: Diagnosis not present

## 2018-03-14 DIAGNOSIS — K8010 Calculus of gallbladder with chronic cholecystitis without obstruction: Secondary | ICD-10-CM | POA: Diagnosis not present

## 2018-03-14 DIAGNOSIS — K740 Hepatic fibrosis: Secondary | ICD-10-CM | POA: Diagnosis not present

## 2018-03-14 DIAGNOSIS — Y92234 Operating room of hospital as the place of occurrence of the external cause: Secondary | ICD-10-CM | POA: Diagnosis not present

## 2018-03-14 DIAGNOSIS — R1011 Right upper quadrant pain: Secondary | ICD-10-CM | POA: Diagnosis not present

## 2018-03-14 HISTORY — PX: GALLBLADDER SURGERY: SHX652

## 2018-03-23 DIAGNOSIS — Z6833 Body mass index (BMI) 33.0-33.9, adult: Secondary | ICD-10-CM | POA: Diagnosis not present

## 2018-03-23 DIAGNOSIS — Z09 Encounter for follow-up examination after completed treatment for conditions other than malignant neoplasm: Secondary | ICD-10-CM | POA: Diagnosis not present

## 2018-03-31 ENCOUNTER — Telehealth: Payer: Self-pay | Admitting: Podiatry

## 2018-03-31 MED ORDER — NEOMYCIN-POLYMYXIN-HC 3.5-10000-1 OT SOLN: OTIC | 0 refills | Status: DC

## 2018-03-31 NOTE — Telephone Encounter (Signed)
Left message for pt to call concerning the nail medication.

## 2018-03-31 NOTE — Addendum Note (Signed)
Addended by: Alphia Kava'CONNELL, Marra Fraga D on: 03/31/2018 12:04 PM   Modules accepted: Orders

## 2018-03-31 NOTE — Telephone Encounter (Addendum)
Pt called and states she is still soaking the toe as after the procedure, but accidentally pulled the nail off. I asked pt is she had an open area and she states it did pull up skin she she pulled the nail off and would like the drops to continue to use after the soaks. I informed pt if she had redness, swelling, signs of infection to make an appt, and she said she would but with the holidays and work it may be a while.

## 2018-03-31 NOTE — Telephone Encounter (Signed)
Pt needs a refill on her nail medication. Nail has fallen off and the medication she had from Dr. Samuella CotaPrice previously, her cat knocked it over and spilled it. Please call patient

## 2018-04-01 DIAGNOSIS — Z09 Encounter for follow-up examination after completed treatment for conditions other than malignant neoplasm: Secondary | ICD-10-CM | POA: Diagnosis not present

## 2018-04-04 DIAGNOSIS — Z6832 Body mass index (BMI) 32.0-32.9, adult: Secondary | ICD-10-CM | POA: Diagnosis not present

## 2018-04-04 DIAGNOSIS — K861 Other chronic pancreatitis: Secondary | ICD-10-CM | POA: Diagnosis not present

## 2018-04-04 DIAGNOSIS — F1011 Alcohol abuse, in remission: Secondary | ICD-10-CM | POA: Diagnosis not present

## 2018-04-04 DIAGNOSIS — K740 Hepatic fibrosis: Secondary | ICD-10-CM | POA: Diagnosis not present

## 2018-04-14 ENCOUNTER — Encounter: Payer: Self-pay | Admitting: Gastroenterology

## 2018-04-20 ENCOUNTER — Encounter: Payer: Self-pay | Admitting: Gastroenterology

## 2018-04-21 ENCOUNTER — Encounter: Payer: Self-pay | Admitting: Gastroenterology

## 2018-04-21 ENCOUNTER — Other Ambulatory Visit (INDEPENDENT_AMBULATORY_CARE_PROVIDER_SITE_OTHER): Payer: BLUE CROSS/BLUE SHIELD

## 2018-04-21 ENCOUNTER — Ambulatory Visit (INDEPENDENT_AMBULATORY_CARE_PROVIDER_SITE_OTHER): Payer: BLUE CROSS/BLUE SHIELD | Admitting: Gastroenterology

## 2018-04-21 VITALS — BP 132/80 | HR 81 | Ht 64.0 in | Wt 184.1 lb

## 2018-04-21 DIAGNOSIS — K580 Irritable bowel syndrome with diarrhea: Secondary | ICD-10-CM

## 2018-04-21 DIAGNOSIS — R1032 Left lower quadrant pain: Secondary | ICD-10-CM

## 2018-04-21 DIAGNOSIS — K769 Liver disease, unspecified: Secondary | ICD-10-CM

## 2018-04-21 DIAGNOSIS — K7460 Unspecified cirrhosis of liver: Secondary | ICD-10-CM | POA: Diagnosis not present

## 2018-04-21 LAB — COMPREHENSIVE METABOLIC PANEL
ALT: 23 U/L (ref 0–35)
AST: 21 U/L (ref 0–37)
Albumin: 4.3 g/dL (ref 3.5–5.2)
Alkaline Phosphatase: 103 U/L (ref 39–117)
BUN: 10 mg/dL (ref 6–23)
CO2: 29 meq/L (ref 19–32)
Calcium: 9.8 mg/dL (ref 8.4–10.5)
Chloride: 102 mEq/L (ref 96–112)
Creatinine, Ser: 0.57 mg/dL (ref 0.40–1.20)
GFR: 108.06 mL/min (ref >60.00)
Glucose, Bld: 141 mg/dL — ABNORMAL HIGH (ref 70–99)
Potassium: 4.4 mEq/L (ref 3.5–5.1)
SODIUM: 138 meq/L (ref 135–145)
Total Bilirubin: 1.2 mg/dL (ref 0.2–1.2)
Total Protein: 7.5 g/dL (ref 6.0–8.3)

## 2018-04-21 LAB — CBC WITH DIFFERENTIAL/PLATELET
Basophils Absolute: 0 10*3/uL (ref 0.0–0.1)
Basophils Relative: 0.6 % (ref 0.0–3.0)
Eosinophils Absolute: 0.2 10*3/uL (ref 0.0–0.7)
Eosinophils Relative: 2.9 % (ref 0.0–5.0)
HCT: 48 % — ABNORMAL HIGH (ref 36.0–46.0)
Hemoglobin: 15.7 g/dL — ABNORMAL HIGH (ref 12.0–15.0)
LYMPHS PCT: 17.4 % (ref 12.0–46.0)
Lymphs Abs: 1.3 10*3/uL (ref 0.7–4.0)
MCHC: 32.8 g/dL (ref 30.0–36.0)
MCV: 86.5 fl (ref 78.0–100.0)
Monocytes Absolute: 0.5 10*3/uL (ref 0.1–1.0)
Monocytes Relative: 6.8 % (ref 3.0–12.0)
Neutro Abs: 5.5 10*3/uL (ref 1.4–7.7)
Neutrophils Relative %: 72.3 % (ref 43.0–77.0)
Platelets: 198 10*3/uL (ref 150.0–400.0)
RBC: 5.54 Mil/uL — ABNORMAL HIGH (ref 3.87–5.11)
RDW: 13.9 % (ref 11.5–15.5)
WBC: 7.6 10*3/uL (ref 4.0–10.5)

## 2018-04-21 LAB — LIPID PANEL
Cholesterol: 147 mg/dL (ref 0–200)
HDL: 51.4 mg/dL (ref >39.00)
LDL Cholesterol: 64 mg/dL (ref 0–99)
NonHDL: 95.96
Total CHOL/HDL Ratio: 3
Triglycerides: 158 mg/dL — ABNORMAL HIGH (ref 0.0–149.0)
VLDL: 31.6 mg/dL (ref 0.0–40.0)

## 2018-04-21 LAB — GAMMA GT: GGT: 33 U/L (ref 7–51)

## 2018-04-21 LAB — PROTIME-INR
INR: 1.1 ratio — ABNORMAL HIGH (ref 0.8–1.0)
Prothrombin Time: 12.9 s (ref 9.6–13.1)

## 2018-04-21 LAB — LIPASE: Lipase: 38 U/L (ref 11.0–59.0)

## 2018-04-21 LAB — AMMONIA: Ammonia: 23 umol/L (ref 11–35)

## 2018-04-21 LAB — HEMOGLOBIN A1C: Hgb A1c MFr Bld: 7.9 % — ABNORMAL HIGH (ref 4.6–6.5)

## 2018-04-21 MED ORDER — DICYCLOMINE HCL 10 MG PO CAPS: ORAL_CAPSULE | ORAL | 3 refills | Status: AC

## 2018-04-21 NOTE — Patient Instructions (Signed)
If you are age 61 or older, your body mass index should be between 23-30. Your Body mass index is 31.6 kg/m. If this is out of the aforementioned range listed, please consider follow up with your Primary Care Provider.  If you are age 61 or younger, your body mass index should be between 19-25. Your Body mass index is 31.6 kg/m. If this is out of the aformentioned range listed, please consider follow up with your Primary Care Provider.     You have been scheduled for a CT scan of the abdomen and pelvis at [place].    You are scheduled on 04/26/18 at [place] should arrive 15 minutes prior to your appointment time for registration. Please follow the written instructions below on the day of your exam:  WARNING: IF YOU ARE ALLERGIC TO IODINE/X-RAY DYE, PLEASE NOTIFY RADIOLOGY IMMEDIATELY AT [phone]! YOU WILL BE GIVEN A 13 HOUR PREMEDICATION PREP.  1) Do not eat or drink anything after 5am (4 hours prior to your test) 2) You have been given 2 bottles of oral contrast to drink. The solution may taste better if refrigerated, but do NOT add ice or any other liquid to this solution. Shake well before drinking.    Drink 1 bottle of contrast @ 7am (2 hours prior to your exam)  Drink 1 bottle of contrast @ 8am (1 hour prior to your exam)  You may take any medications as prescribed with a small amount of water, if necessary. If you take any of the following medications: METFORMIN, GLUCOPHAGE, GLUCOVANCE, AVANDAMET, RIOMET, FORTAMET, [place] MET, JANUMET, GLUMETZA or METAGLIP, you MAY be asked to HOLD this medication 48 hours AFTER the exam.  The purpose of you drinking the oral contrast is to aid in the visualization of your intestinal tract. The contrast solution may cause some diarrhea. Depending on your individual set of symptoms, you may also receive an intravenous injection of x-ray contrast/dye. Plan on being at [facility] for 30 minutes or longer, depending on the type of  exam you are having performed.  This test typically takes 30-45 minutes to complete.  If you have any questions regarding your exam or if you need to reschedule, you may call the CT department at [phone] between the hours of 8:00 am and 5:00 pm, Monday-Friday.  ________________________________________________________________________  Please go to the lab on the 2nd floor [address] before you leave the office today.   We have sent the following medications to your pharmacy for you to pick up at your convenience: Bentyl  Thank you,  Dr. Jackquline Denmark

## 2018-04-21 NOTE — Progress Notes (Signed)
Chief Complaint: Abn Liver biopsy  Referring Provider:  Abner Greenspan   ASSESSMENT AND PLAN;   #1. Early Liver cirrhosis likely d/t NASH on liver Bx at time of lap chole 03/2018 (nodular appearance on laparoscopy, liver Bx-moderate steatosis with bridging fibrosis- Dr Lequita Halt). Has associated DM, HLD and mild obesity. No ETOH. #2. IBS with diarrhea.  Element of postcholecystectomy diarrhea. Neg colonoscopy with random Bx 12/14/2017 at [facility], neg stool studies.  Failed Viberzi. #3. GERD with postprandial nausea. Neg EGD with SB Bx 12/2017, neg EUS 12/2017 (cannot visualize the actual report), Nl solid-phase gastric emptying scan 06/2017. #3. LLQ pain  Plan: -Proceed with CT abdomen/pelvis with p.o. and IV contrast -Trial of Bentyl 10 mg p.o. twice daily. -[name] had several questions regarding milk thistle.  She can start taking milk thistle twice a day.  -Discussed extensively regarding weight loss.  Aim is to reduce 6lb over next 3 months.  Avoid sodas, fried foods and start exercising. -Check CBC, CMP, lipase, HBA1c, acute viral hepatitis, autoimmune hepatitis panel (AMA, anti-smooth muscle antibody and anti-LKM), ANA,  iron studies, serum ceruloplasmin, alpha-1 antitrypsin, celiac screen, fasting lipid profile and GGT. Check ammonia, alpha-fetoprotein and PT today. Check anti-HAV total Ab and HBsAb.  If negative, would recommend vaccination for hepatitis A and B. -FU in 12 weeks.   HPI:    Chelsea Mcbride is a 63 y.o. female  Underwent laparoscopic cholecystectomy with Intra-Op cholangiogram and Tru-Cut liver biopsy on 03/14/2018. Liver biopsy showed liver cirrhosis, negative for alpha-1 antitrypsin stains and iron staining. Sent to the GI clinic for possible further evaluation.  No history of itching, skin lesions, easy bruisability, intake of over-the-counter medications including diet pills, herbal medications, anabolic steroids or Tylenol.  There is no history of blood transfusions,  IV drug use, no family history of liver disease.  No jaundice dark urine or pale stools.  No history of alcohol abuse.  Denies having any significant mental status changes or forgetfulness.  Longstanding history of multiple GI complaints -diagnosed with IBS with diarrhea, underwent EGD and colon 12/2017- neg with negative biopsies at [facility]  2-3/day diarrhea x 1.5 year. Cramping. More with raw vegs, no nocturnal, mucus.   Nausea, after eating, neg EGD, solid phase GES.  Dad had pancreatic cancer.  Denies intake of sodas chocolates chewing gums or mints.  No weight loss.  Has been using Aleve 1/day.   Past Medical History:  Diagnosis Date  . Anxiety   . Arthritis   . Asthma   . Bipolar disorder (HCC)   . Colon polyp    at age 35  . Depression   . Diabetes mellitus (HCC)   . Elevated cholesterol   . Fibromyalgia   . GERD (gastroesophageal reflux disease)   . HTN (hypertension)   . Hypothyroidism   . IBS (irritable bowel syndrome)   . Kidney stones   . Lumbago   . Pancreatitis   . UTI (urinary tract infection)     Past Surgical History:  Procedure Laterality Date  . ABDOMINAL HYSTERECTOMY    . COLONOSCOPY WITH ESOPHAGOGASTRODUODENOSCOPY (EGD)  01/2018   with [facility]   . GALLBLADDER SURGERY  03/14/2018   Has advance fibrous of the liver.  . Knee replacement Bilateral 2009  . TOOTH EXTRACTION     recently had a tooth pulled per patient     Family History  Problem Relation Age of Onset  . Pancreatic cancer Father   . Liver disease Father   .  Colon cancer Paternal Grandmother   . Colon cancer Paternal Grandfather   . Diabetes Paternal Grandfather   . Heart disease Paternal Grandfather   . Diabetes Mother   . Kidney disease Mother   . Heart disease Maternal Grandmother   . Bladder Cancer Maternal Grandmother   . Diabetes Maternal Grandfather   . Irritable bowel syndrome Daughter   . Kidney cancer Daughter [id number]  . Breast cancer Maternal Aunt   . Esophageal  cancer Neg Hx     Social History   Tobacco Use  . Smoking status: Never Smoker  . Smokeless tobacco: Never Used  Substance Use Topics  . Alcohol use: Not Currently  . Drug use: Never    Current Outpatient Medications  Medication Sig Dispense Refill  . AMBULATORY NON FORMULARY MEDICATION 1 tablet daily. Tumeric, curcumin 500mg  and ginger 50mg     . APPLE CIDER VINEGAR PO Take 1 oz by mouth daily.    Marland Kitchen. atorvastatin (LIPITOR) 10 MG tablet Take 10 mg by mouth daily.    . citalopram (CELEXA) 40 MG tablet Take 40 mg by mouth daily.    . Cyanocobalamin (VITAMIN B-12 PO) Take 2,500 mg by mouth daily.    . diphenhydrAMINE (BENADRYL) 25 mg capsule Take 25 mg by mouth daily.    Marland Kitchen. ELDERBERRY PO Take 1 tablet by mouth as needed (they are gummies).    . empagliflozin (JARDIANCE) 25 MG TABS tablet Take 25 mg by mouth daily.    Marland Kitchen. Fexofenadine HCl (ALLEGRA PO) Take 1 tablet by mouth as needed.    . gabapentin (NEURONTIN) 300 MG capsule Take 1 capsule (300 mg total) by mouth at bedtime. 30 capsule 0  . IBUPROFEN PO Take 1 tablet by mouth as needed.    . Insulin Glargine (LANTUS [place]) Inject 25-30 Units into the skin as needed (if she does a high card meal).    . Insulin Lispro (HUMALOG [place]) Inject 25-30 Units into the skin as needed (said if she has a high carb meal she may use this).    Marland Kitchen. lisinopril (PRINIVIL,ZESTRIL) 10 MG tablet Take 10 mg by mouth daily.    . metFORMIN (GLUCOPHAGE) 1000 MG tablet Take 1 tablet by mouth 2 (two) times daily.    . Semaglutide,0.25 or 0.5MG /DOS, (OZEMPIC, 0.25 OR 0.5 MG/DOSE,) 2 MG/1.5ML SOPN Inject 2.5 mg into the skin once a week.     No current facility-administered medications for this visit.     Allergies  Allergen Reactions  . Penicillins Hives    Review of Systems:  Constitutional: Denies fever, chills, diaphoresis, appetite change and has fatigue.  HEENT: Denies photophobia, eye pain, redness, hearing loss, ear pain, congestion, sore throat, rhinorrhea,  sneezing, mouth sores, neck pain, neck stiffness and tinnitus.   Respiratory: Denies SOB, DOE, cough, chest tightness,  and wheezing.   Cardiovascular: Denies chest pain, palpitations and leg swelling.  Genitourinary: Denies dysuria, urgency, frequency, hematuria, flank pain and difficulty urinating.  Musculoskeletal: Has myalgias, back pain, No joint swelling, arthralgias and gait problem.  Skin: No rash.  Neurological: Denies dizziness, seizures, syncope, weakness, light-headedness, numbness and has headaches.  Hematological: Denies adenopathy. Easy bruising, personal or family bleeding history  Psychiatric/Behavioral: Has anxiety or depression.  Has problems sleeping.     Physical Exam:    BP 132/80   Pulse 81   Ht 5\' 4"  (1.626 m)   Wt 184 lb 2 oz (83.5 kg)   BMI 31.60 kg/m  Filed Weights   04/21/18 1032  Weight: 184 lb 2 oz (83.5 kg)   Constitutional:  Well-developed, in no acute distress. Psychiatric: Normal mood and affect. Behavior is normal. HEENT: Pupils normal.  Conjunctivae are normal. No scleral icterus. Neck supple.  Cardiovascular: Normal rate, regular rhythm. No edema Pulmonary/chest: Effort normal and breath sounds normal. No wheezing, rales or rhonchi. Abdominal: Soft, nondistended.  Healing surgical scars.  Left lower quadrant abdominal tenderness without rebound. Bowel sounds active throughout. There are no masses palpable. No hepatomegaly. Rectal:  defered Neurological: Alert and oriented to person place and time. Skin: Skin is warm and dry. No rashes noted.    Edman Circleaj Jhonny Calixto, MD 04/21/2018, 11:00 AM  Cc: Charlott RakesHodges, [place], MD

## 2018-04-23 LAB — CELIAC PANEL 10
Antigliadin Abs, IgA: 6 units (ref 0–19)
Endomysial IgA: NEGATIVE
Gliadin IgG: 2 units (ref 0–19)
IgA/Immunoglobulin A, Serum: 351 mg/dL (ref 87–352)
Tissue Transglut Ab: 8 U/mL — ABNORMAL HIGH (ref 0–5)
Transglutaminase IgA: <2 U/mL (ref 0–3)

## 2018-04-25 LAB — ANTI-MICROSOMAL ANTIBODY LIVER / KIDNEY: LKM1 Ab: <=20 U (ref <=20.0)

## 2018-04-26 ENCOUNTER — Ambulatory Visit (INDEPENDENT_AMBULATORY_CARE_PROVIDER_SITE_OTHER): Payer: BLUE CROSS/BLUE SHIELD | Admitting: Gastroenterology

## 2018-04-26 ENCOUNTER — Ambulatory Visit (HOSPITAL_BASED_OUTPATIENT_CLINIC_OR_DEPARTMENT_OTHER)
Admission: RE | Admit: 2018-04-26 | Discharge: 2018-04-26 | Disposition: A | Discharge status: Home / Self Care | Payer: BLUE CROSS/BLUE SHIELD | Source: Ambulatory Visit | Attending: Gastroenterology | Admitting: Gastroenterology

## 2018-04-26 VITALS — BP 108/70 | HR 81 | Ht 64.0 in | Wt 185.4 lb

## 2018-04-26 DIAGNOSIS — Z23 Encounter for immunization: Secondary | ICD-10-CM | POA: Diagnosis not present

## 2018-04-26 DIAGNOSIS — K7460 Unspecified cirrhosis of liver: Secondary | ICD-10-CM

## 2018-04-26 DIAGNOSIS — R1032 Left lower quadrant pain: Secondary | ICD-10-CM | POA: Insufficient documentation

## 2018-04-26 DIAGNOSIS — K449 Diaphragmatic hernia without obstruction or gangrene: Secondary | ICD-10-CM | POA: Diagnosis not present

## 2018-04-26 DIAGNOSIS — K580 Irritable bowel syndrome with diarrhea: Secondary | ICD-10-CM

## 2018-04-26 DIAGNOSIS — K769 Liver disease, unspecified: Secondary | ICD-10-CM

## 2018-04-26 LAB — HEPATITIS A ANTIBODY, TOTAL: Hepatitis A AB,Total: NONREACTIVE

## 2018-04-26 LAB — HEPATITIS PANEL, ACUTE
HEP A IGM: NONREACTIVE
Hep B C IgM: NONREACTIVE
Hepatitis B Surface Ag: NONREACTIVE
Hepatitis C Ab: NONREACTIVE
SIGNAL TO CUT-OFF: 0.02 (ref <1.00)

## 2018-04-26 LAB — MITOCHONDRIAL ANTIBODIES: Mitochondrial M2 Ab, IgG: <=20 U

## 2018-04-26 LAB — ANTI-SMOOTH MUSCLE ANTIBODY, IGG

## 2018-04-26 LAB — AFP TUMOR MARKER: AFP-Tumor Marker: 4.3 ng/mL

## 2018-04-26 LAB — HEPATITIS B SURFACE ANTIBODY,QUALITATIVE: Hep B S Ab: NONREACTIVE

## 2018-04-26 MED ORDER — IOPAMIDOL (ISOVUE-300) INJECTION 61%
100.0000 mL | Freq: Once | INTRAVENOUS | Status: AC | PRN
  Administered 2018-04-26: 100 mL via INTRAVENOUS

## 2018-04-27 ENCOUNTER — Other Ambulatory Visit: Payer: Self-pay | Admitting: Gastroenterology

## 2018-04-27 DIAGNOSIS — Z23 Encounter for immunization: Secondary | ICD-10-CM | POA: Diagnosis not present

## 2018-05-13 DIAGNOSIS — Z6832 Body mass index (BMI) 32.0-32.9, adult: Secondary | ICD-10-CM | POA: Diagnosis not present

## 2018-05-13 DIAGNOSIS — K7469 Other cirrhosis of liver: Secondary | ICD-10-CM | POA: Diagnosis not present

## 2018-05-13 DIAGNOSIS — E1140 Type 2 diabetes mellitus with diabetic neuropathy, unspecified: Secondary | ICD-10-CM | POA: Diagnosis not present

## 2018-06-03 DIAGNOSIS — Z6831 Body mass index (BMI) 31.0-31.9, adult: Secondary | ICD-10-CM | POA: Diagnosis not present

## 2018-06-03 DIAGNOSIS — E1140 Type 2 diabetes mellitus with diabetic neuropathy, unspecified: Secondary | ICD-10-CM | POA: Diagnosis not present

## 2018-06-03 DIAGNOSIS — R300 Dysuria: Secondary | ICD-10-CM | POA: Diagnosis not present

## 2018-06-13 ENCOUNTER — Telehealth: Payer: Self-pay | Admitting: Gastroenterology

## 2018-06-13 NOTE — Telephone Encounter (Signed)
Left message for patient to call back to the office;  

## 2018-06-14 NOTE — Telephone Encounter (Signed)
Pt return call °

## 2018-06-14 NOTE — Telephone Encounter (Signed)
Left message for patient to call back  

## 2018-06-14 NOTE — Telephone Encounter (Signed)
Patient is encouraged to contact her PCP for urinary symptoms.  She will call back for GI concerns.

## 2018-06-20 DIAGNOSIS — R300 Dysuria: Secondary | ICD-10-CM | POA: Diagnosis not present

## 2018-06-20 DIAGNOSIS — E860 Dehydration: Secondary | ICD-10-CM | POA: Diagnosis not present

## 2018-06-20 DIAGNOSIS — Z6831 Body mass index (BMI) 31.0-31.9, adult: Secondary | ICD-10-CM | POA: Diagnosis not present

## 2018-07-05 DIAGNOSIS — R05 Cough: Secondary | ICD-10-CM | POA: Diagnosis not present

## 2018-07-05 DIAGNOSIS — Z7189 Other specified counseling: Secondary | ICD-10-CM | POA: Diagnosis not present

## 2018-07-13 ENCOUNTER — Ambulatory Visit: Payer: BLUE CROSS/BLUE SHIELD | Admitting: Gastroenterology

## 2018-07-22 DIAGNOSIS — R300 Dysuria: Secondary | ICD-10-CM | POA: Diagnosis not present

## 2018-07-22 DIAGNOSIS — Z7189 Other specified counseling: Secondary | ICD-10-CM | POA: Diagnosis not present

## 2018-07-22 DIAGNOSIS — N898 Other specified noninflammatory disorders of vagina: Secondary | ICD-10-CM | POA: Diagnosis not present

## 2018-07-22 DIAGNOSIS — E1140 Type 2 diabetes mellitus with diabetic neuropathy, unspecified: Secondary | ICD-10-CM | POA: Diagnosis not present

## 2018-07-25 ENCOUNTER — Other Ambulatory Visit: Payer: Self-pay

## 2018-07-25 ENCOUNTER — Ambulatory Visit (INDEPENDENT_AMBULATORY_CARE_PROVIDER_SITE_OTHER): Payer: BLUE CROSS/BLUE SHIELD | Admitting: Podiatry

## 2018-07-25 DIAGNOSIS — L600 Ingrowing nail: Secondary | ICD-10-CM

## 2018-07-25 DIAGNOSIS — M79676 Pain in unspecified toe(s): Secondary | ICD-10-CM

## 2018-07-25 NOTE — Progress Notes (Signed)
Subjective:  Patient ID: Chelsea Mcbride, female    DOB: October 20, 1955,  MRN: [id number]  Chief Complaint  Patient presents with  . Ingrown Toenail    63 y.o. female presents with the above complaint. Complains of ingrown nails to the left 3rd and 4th toes. Present on and off for years   Review of Systems: Negative except as noted in the HPI. Denies N/V/F/Ch.  Past Medical History:  Diagnosis Date  . Anxiety   . Arthritis   . Asthma   . Bipolar disorder (HCC)   . Colon polyp    at age 63  . Depression   . Diabetes mellitus (HCC)   . Elevated cholesterol   . Fibromyalgia   . GERD (gastroesophageal reflux disease)   . HTN (hypertension)   . Hypothyroidism   . IBS (irritable bowel syndrome)   . Kidney stones   . Lumbago   . Neuropathy   . Pancreatitis   . UTI (urinary tract infection)     Current Outpatient Medications:  .  AMBULATORY NON FORMULARY MEDICATION, 1 tablet daily. Tumeric, curcumin 500mg  and ginger 50mg , Disp: , Rfl:  .  APPLE CIDER VINEGAR PO, Take 1 oz by mouth daily., Disp: , Rfl:  .  atorvastatin (LIPITOR) 10 MG tablet, Take 10 mg by mouth daily., Disp: , Rfl:  .  benzonatate (TESSALON) 100 MG capsule, TAKE 1 CAPSULE BY MOUTH THREE TIMES DAILY AS NEEDED, Disp: , Rfl:  .  citalopram (CELEXA) 40 MG tablet, Take 40 mg by mouth daily., Disp: , Rfl:  .  Cyanocobalamin (VITAMIN B-12 PO), Take 2,500 mg by mouth daily., Disp: , Rfl:  .  dicyclomine (BENTYL) 10 MG capsule, Take one by moth 1/2 hour before lunch and bedtime., Disp: 60 capsule, Rfl: 3 .  diphenhydrAMINE (BENADRYL) 25 mg capsule, Take 25 mg by mouth daily., Disp: , Rfl:  .  ELDERBERRY PO, Take 1 tablet by mouth as needed (they are gummies)., Disp: , Rfl:  .  empagliflozin (JARDIANCE) 25 MG TABS tablet, Take 25 mg by mouth daily., Disp: , Rfl:  .  Fexofenadine HCl (ALLEGRA PO), Take 1 tablet by mouth as needed., Disp: , Rfl:  .  fluconazole (DIFLUCAN) 150 MG tablet, , Disp: , Rfl:  .  gabapentin  (NEURONTIN) 300 MG capsule, Take 1 capsule (300 mg total) by mouth at bedtime., Disp: 30 capsule, Rfl: 0 .  IBUPROFEN PO, Take 1 tablet by mouth as needed., Disp: , Rfl:  .  Insulin Glargine (LANTUS [place]), Inject 25-30 Units into the skin as needed (if she does a high card meal)., Disp: , Rfl:  .  Insulin Lispro (HUMALOG [place]), Inject 25-30 Units into the skin as needed (said if she has a high carb meal she may use this)., Disp: , Rfl:  .  lisinopril (PRINIVIL,ZESTRIL) 10 MG tablet, Take 10 mg by mouth daily., Disp: , Rfl:  .  metFORMIN (GLUCOPHAGE) 1000 MG tablet, Take 1 tablet by mouth 2 (two) times daily., Disp: , Rfl:  .  nystatin ointment (MYCOSTATIN), , Disp: , Rfl:  .  Semaglutide,0.25 or 0.5MG /DOS, (OZEMPIC, 0.25 OR 0.5 MG/DOSE,) 2 MG/1.5ML SOPN, Inject 2.5 mg into the skin once a week., Disp: , Rfl:   Social History   Tobacco Use  Smoking Status Never Smoker  Smokeless Tobacco Never Used    Allergies  Allergen Reactions  . Penicillins Hives   Objective:  There were no vitals filed for this visit. There is no height or weight on  file to calculate BMI. Constitutional Well developed. Well nourished.  Vascular Dorsalis pedis pulses palpable bilaterally. Posterior tibial pulses palpable bilaterally. Capillary refill normal to all digits.  No cyanosis or clubbing noted. Pedal hair growth normal.  Neurologic Normal speech. Oriented to person, place, and time. Epicritic sensation to light touch grossly present bilaterally.  Dermatologic Painful ingrowing nail at left 3rd toe lateral border, left 4th toe medial border. No other open wounds. No skin lesions.  Orthopedic: Normal joint ROM without pain or crepitus bilaterally. No visible deformities. No bony tenderness.   Radiographs: None Assessment:   1. Ingrown nail   2. Pain around toenail    Plan:  Patient was evaluated and treated and all questions answered.  Ingrown Nail, left -Patient elects to proceed with minor  surgery to remove ingrown toenail removal today. Consent reviewed and signed by patient. -Ingrown nail excised. See procedure note. -Educated on post-procedure care including soaking. Written instructions provided and reviewed. -Patient to follow up in 2 weeks for nail check.  Procedure: Excision of Ingrown Toenail Location: Left 3rd toe and 4th toe  Anesthesia: Lidocaine 1% plain; 1.5 mL and Marcaine 0.5% plain; 1.5 mL, digital block. Skin Prep: Betadine. Dressing: Silvadene; telfa; dry, sterile, compression dressing. Technique: Following skin prep, the toe was exsanguinated and a tourniquet was secured at the base of the toe. The affected nail border was freed, split with a nail splitter, and excised. Chemical matrixectomy was then performed with phenol and irrigated out with alcohol. The tourniquet was then removed and sterile dressing applied. Disposition: Patient tolerated procedure well. Patient to return in 2 weeks for follow-up.   Return in about 2 weeks (around 08/08/2018) for Nail Check, Bilateral.

## 2018-07-25 NOTE — Patient Instructions (Signed)

## 2018-07-28 ENCOUNTER — Ambulatory Visit (INDEPENDENT_AMBULATORY_CARE_PROVIDER_SITE_OTHER): Payer: BLUE CROSS/BLUE SHIELD | Admitting: Sports Medicine

## 2018-07-28 ENCOUNTER — Encounter: Payer: Self-pay | Admitting: Sports Medicine

## 2018-07-28 ENCOUNTER — Ambulatory Visit (INDEPENDENT_AMBULATORY_CARE_PROVIDER_SITE_OTHER): Payer: BLUE CROSS/BLUE SHIELD

## 2018-07-28 ENCOUNTER — Other Ambulatory Visit: Payer: Self-pay

## 2018-07-28 VITALS — Temp 97.6°F | Resp 16

## 2018-07-28 DIAGNOSIS — Z9889 Other specified postprocedural states: Secondary | ICD-10-CM

## 2018-07-28 DIAGNOSIS — S92515A Nondisplaced fracture of proximal phalanx of left lesser toe(s), initial encounter for closed fracture: Secondary | ICD-10-CM | POA: Diagnosis not present

## 2018-07-28 DIAGNOSIS — M79672 Pain in left foot: Secondary | ICD-10-CM | POA: Diagnosis not present

## 2018-07-28 DIAGNOSIS — S90122A Contusion of left lesser toe(s) without damage to nail, initial encounter: Secondary | ICD-10-CM | POA: Diagnosis not present

## 2018-07-28 DIAGNOSIS — L309 Dermatitis, unspecified: Secondary | ICD-10-CM

## 2018-07-28 DIAGNOSIS — E1100 Type 2 diabetes mellitus with hyperosmolarity without nonketotic hyperglycemic-hyperosmolar coma (NKHHC): Secondary | ICD-10-CM

## 2018-07-28 MED ORDER — HYDROCORTISONE 1 % EX OINT: 1.0000 "application " | TOPICAL_OINTMENT | Freq: Two times a day (BID) | CUTANEOUS | 0 refills | Status: AC

## 2018-07-28 MED ORDER — CLINDAMYCIN HCL 300 MG PO CAPS: 300.0000 mg | ORAL_CAPSULE | Freq: Three times a day (TID) | ORAL | 0 refills | Status: AC

## 2018-07-28 NOTE — Progress Notes (Signed)
Subjective: Chelsea Mcbride is a 63 y.o. female patient who presents to office for evaluation of Left foot pain. Patient complains of progressive pain since yesterday with increasing redness to her toenail procedure sites that were performed on Monday with Dr. Samuella Cota and redness to the bases of the third fourth and fifth toes patient is unsure if this is irritation but does state that some of the pain radiates to the top of her foot at the bases of the toes states that the pain is sharp 7 out of 10 with redness and swelling and a small amount of drainage.  Patient admits that her pain worsened after she stopped her left fourth toe on yesterday.  Patient is diabetic and reports that her blood sugar this morning was 174.  Patient denies nausea vomiting fever chills or any other constitutional symptoms at this time has been compliant with soaking with Epson salt and using Corticosporin solution to her toes.  Patient reports that she did soak 1 yesterday twice daily and that seemed to help a little however still concerned because of the redness swelling and irritation and the pain to her toes states that she feels like this site is healing a lot differently than when she had the procedure done on her right foot.  Patient denies any other pedal complaints.   Patient Active Problem List   Diagnosis Date Noted  . Pancreatitis 06/28/2017  . Abdominal pain, left upper quadrant 03/01/2017  . Nausea 03/01/2017  . Type 2 diabetes mellitus (HCC) 03/01/2017    Current Outpatient Medications on File Prior to Visit  Medication Sig Dispense Refill  . AMBULATORY NON FORMULARY MEDICATION 1 tablet daily. Tumeric, curcumin 500mg  and ginger 50mg     . APPLE CIDER VINEGAR PO Take 1 oz by mouth daily.    Marland Kitchen atorvastatin (LIPITOR) 10 MG tablet Take 10 mg by mouth daily.    . benzonatate (TESSALON) 100 MG capsule TAKE 1 CAPSULE BY MOUTH THREE TIMES DAILY AS NEEDED    . citalopram (CELEXA) 40 MG tablet Take 40 mg by mouth daily.     . Cyanocobalamin (VITAMIN B-12 PO) Take 2,500 mg by mouth daily.    Marland Kitchen dicyclomine (BENTYL) 10 MG capsule Take one by moth 1/2 hour before lunch and bedtime. 60 capsule 3  . diphenhydrAMINE (BENADRYL) 25 mg capsule Take 25 mg by mouth daily.    Marland Kitchen ELDERBERRY PO Take 1 tablet by mouth as needed (they are gummies).    . empagliflozin (JARDIANCE) 25 MG TABS tablet Take 25 mg by mouth daily.    Marland Kitchen Fexofenadine HCl (ALLEGRA PO) Take 1 tablet by mouth as needed.    . fluconazole (DIFLUCAN) 150 MG tablet     . gabapentin (NEURONTIN) 300 MG capsule Take 1 capsule (300 mg total) by mouth at bedtime. 30 capsule 0  . IBUPROFEN PO Take 1 tablet by mouth as needed.    . Insulin Glargine (LANTUS [place]) Inject 25-30 Units into the skin as needed (if she does a high card meal).    . Insulin Lispro (HUMALOG [place]) Inject 25-30 Units into the skin as needed (said if she has a high carb meal she may use this).    Marland Kitchen lisinopril (PRINIVIL,ZESTRIL) 10 MG tablet Take 10 mg by mouth daily.    . metFORMIN (GLUCOPHAGE) 1000 MG tablet Take 1 tablet by mouth 2 (two) times daily.    Marland Kitchen nystatin ointment (MYCOSTATIN)     . Semaglutide,0.25 or 0.5MG /DOS, (OZEMPIC, 0.25 OR 0.5 MG/DOSE,)  2 MG/1.5ML SOPN Inject 2.5 mg into the skin once a week.     No current facility-administered medications on file prior to visit.     Allergies  Allergen Reactions  . Penicillins Hives    Objective:  General: Alert and oriented x3 in no acute distress  Dermatology: There is a splotchy rash noted at the bases of the third fourth and fifth toes at the dorsal interdigital space on the left foot likely consistent with dermatitis possible adverse reaction to soaking agent for material that she is using to bandage or wrap her toes versus contusion injury since patient stubbed her left fourth toe on yesterday, procedure sites at the left third toe lateral margin and right fourth toe medial margin appeared to be scabbing over with mild granular tissue  noted, there is mild clear serosanguineous drainage that could be possibly a sign of localized infection and mild soft tissue swelling that could have been the result of patient stubbing her toes, no webspace macerations, no warmth, no malodor to the left foot.  Vascular: Focal edema noted to left third and fourth toes. Dorsalis Pedis and Posterior Tibial pedal pulses 2/4, Capillary Fill Time 3 seconds,(+) pedal hair growth bilateral, Temperature gradient within normal limits with no increase in temperature at left third and fourth toes at procedure site.  Neurology: Michaell CowingGross sensation intact via light touch bilateral.  Musculoskeletal: There is tenderness with palpation at left fourth toe greater than the third toe along the areas of previous nail avulsion procedure site.  Gait: Unassisted, Antalgic gait wearing flip-flops  Xrays  Left Foot   Impression: No obvious fracture or reoccurrence of fracture injury, mild soft tissue swelling at digit, no other acute findings.    Assessment and Plan: Problem List Items Addressed This Visit      Endocrine   Type 2 diabetes mellitus (HCC)    Other Visit Diagnoses    Closed nondisplaced fracture of proximal phalanx of lesser toe of left foot, initial encounter    -  Primary   No recurrent fracture   Relevant Orders   DG Foot Complete Left   Contusion of fourth toe of left foot, initial encounter       07-27-18   S/P nail surgery       Dermatitis       Relevant Orders   DG Foot Complete Left   Left foot pain       Relevant Orders   DG Foot Complete Left       -Complete examination performed -Discussed treatement options for possible adverse reaction after nail procedure, dermatitis with stub injury with contusion -X-rays negative for fracture -Advised patient to switch from Epson salt to Betadine or soapy water soaks for possible reaction or irritation to her skin -Advised patient to refrain from using adhesives or irritants on the toes  may use a simple Band-Aid or light gauze and paper tape and Corticosporin solution however I did advise patient to make sure she is letting toes air out at night and leaving them open to air at bedtime to decrease any irritation to toes -Prescribed cortisone cream for patient to use areas of irritation at the bases of her toes -Prescribed clindamycin for patient to take for preventative measures for possible superimposed infection -Advised patient to return to postop shoe that she has at home for contusion injury after stubbing her toes and to avoid walking barefoot to prevent reinjury -Recommend protection, rest, ice, elevation daily until symptoms improve -Advised patient  to continue with Tylenol as needed for pain -Patient to return to office as scheduled with Dr. Samuella Cota for follow-up evaluation or sooner if condition worsens.  Asencion Islam, DPM

## 2018-08-01 DIAGNOSIS — R300 Dysuria: Secondary | ICD-10-CM | POA: Diagnosis not present

## 2018-08-08 ENCOUNTER — Ambulatory Visit (INDEPENDENT_AMBULATORY_CARE_PROVIDER_SITE_OTHER): Payer: BLUE CROSS/BLUE SHIELD | Admitting: Podiatry

## 2018-08-08 ENCOUNTER — Other Ambulatory Visit: Payer: Self-pay

## 2018-08-08 ENCOUNTER — Encounter: Payer: Self-pay | Admitting: Podiatry

## 2018-08-08 VITALS — Temp 97.4°F | Resp 16

## 2018-08-08 DIAGNOSIS — L600 Ingrowing nail: Secondary | ICD-10-CM

## 2018-08-08 DIAGNOSIS — M79676 Pain in unspecified toe(s): Secondary | ICD-10-CM | POA: Diagnosis not present

## 2018-08-08 NOTE — Progress Notes (Signed)
  Subjective:  Patient ID: Chelsea Mcbride, female    DOB: 1955/11/16,  MRN: [id number]  Chief Complaint  Patient presents with  . nail check    F/U for nail check and dermatitis Pt. states," they're still very sore from working 9 hr shift, but they look better." Tx: coritsone cream, cortisporin drops, epsom satl soaking, tylenol and clindamycin (completed) -w/ small amount of clear drainage/redness -pt deneis N/V/F/Ch    63 y.o. female returns for the above complaint.   Objective:   General AA&O x3. Normal mood and affect.  Vascular Foot warm and well perfused with good capillary refill.  Neurologic Sensation grossly intact.  Dermatologic Nail avulsion site healing well with minimal drainage, no erythema. Nail bed with overlying soft crust. No signs of local infection.  Orthopedic: Tenderness to palpation of the toe.   Assessment & Plan:  Patient was evaluated and treated and all questions answered.  S/p Ingrown Toenail Excision, left 3rd/4th toe -Healing well but still draining -Recc betadine soak. -F/u in 2 weeks for final recheck.

## 2018-08-22 ENCOUNTER — Ambulatory Visit: Payer: BLUE CROSS/BLUE SHIELD | Admitting: Podiatry

## 2018-08-25 DIAGNOSIS — E782 Mixed hyperlipidemia: Secondary | ICD-10-CM | POA: Diagnosis not present

## 2018-08-25 DIAGNOSIS — Z20828 Contact with and (suspected) exposure to other viral communicable diseases: Secondary | ICD-10-CM | POA: Diagnosis not present

## 2018-08-25 DIAGNOSIS — N898 Other specified noninflammatory disorders of vagina: Secondary | ICD-10-CM | POA: Diagnosis not present

## 2018-08-25 DIAGNOSIS — I10 Essential (primary) hypertension: Secondary | ICD-10-CM | POA: Diagnosis not present

## 2018-08-25 DIAGNOSIS — E1140 Type 2 diabetes mellitus with diabetic neuropathy, unspecified: Secondary | ICD-10-CM | POA: Diagnosis not present

## 2018-08-25 DIAGNOSIS — R300 Dysuria: Secondary | ICD-10-CM | POA: Diagnosis not present

## 2018-08-28 DIAGNOSIS — R109 Unspecified abdominal pain: Secondary | ICD-10-CM | POA: Diagnosis not present

## 2018-08-28 DIAGNOSIS — Z20828 Contact with and (suspected) exposure to other viral communicable diseases: Secondary | ICD-10-CM | POA: Diagnosis not present

## 2018-09-01 DIAGNOSIS — I10 Essential (primary) hypertension: Secondary | ICD-10-CM | POA: Diagnosis not present

## 2018-09-01 DIAGNOSIS — E782 Mixed hyperlipidemia: Secondary | ICD-10-CM | POA: Diagnosis not present

## 2018-09-01 DIAGNOSIS — R1032 Left lower quadrant pain: Secondary | ICD-10-CM | POA: Diagnosis not present

## 2018-09-01 DIAGNOSIS — E1140 Type 2 diabetes mellitus with diabetic neuropathy, unspecified: Secondary | ICD-10-CM | POA: Diagnosis not present

## 2018-09-12 ENCOUNTER — Ambulatory Visit: Payer: BLUE CROSS/BLUE SHIELD | Admitting: Podiatry

## 2018-11-25 ENCOUNTER — Telehealth: Payer: Self-pay | Admitting: *Deleted

## 2018-11-25 NOTE — Telephone Encounter (Signed)
Pt called states Dr. March Rummage use to be able to give her injections in her foot.

## 2018-11-25 NOTE — Telephone Encounter (Signed)
Pt called again and I informed she had received Dexamethasone.

## 2018-11-25 NOTE — Telephone Encounter (Signed)
Left message informing pt I was unable to understand her message after the statement that Dr. March Rummage used to be able to give her injection, and to call again Monday and I would help.

## 2020-02-10 IMAGING — CT CT ABD-PELV W/ CM
2 of 5 series · 16 of 46 positions shown, 18 images · IV contrast (APPLIED)
Comparison: None.

CLINICAL DATA: 62-year-old female with LEFT abdominal and pelvic
pain for several weeks.

EXAM:
CT ABDOMEN AND PELVIS WITH CONTRAST
TECHNIQUE: Multidetector CT imaging of the abdomen and pelvis was performed
using the standard protocol following bolus administration of
intravenous contrast.
CONTRAST:  100mL F16JCW-ITT IOPAMIDOL (F16JCW-ITT) INJECTION 61%

[Series 2: axial st · axial · 0.83mm/px · z∈[-620,-140]mm · 13 of 108 slices shown, 15 images]
[im 6/108  soft-tissue]
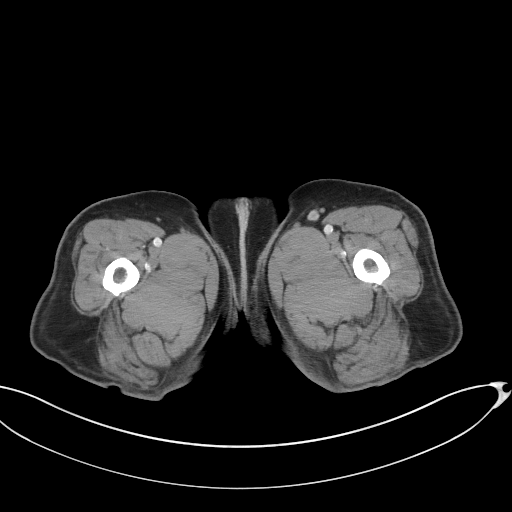
[im 6/108  bone]
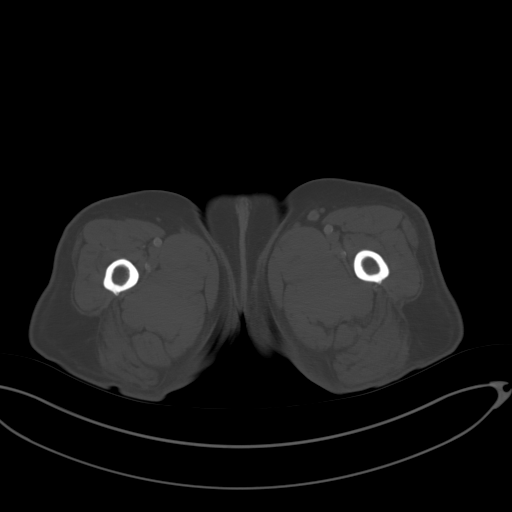
[im 12/108  soft-tissue]
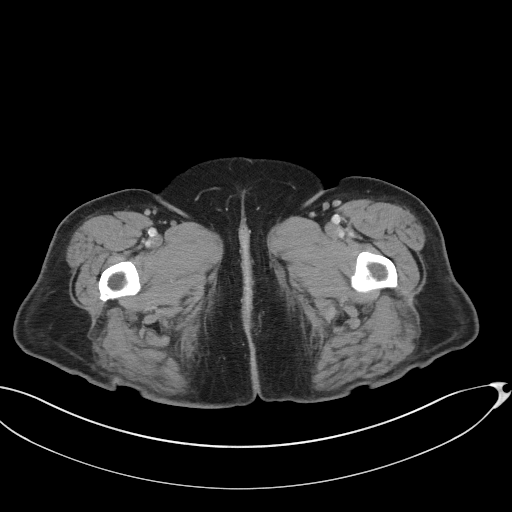
[im 24/108  soft-tissue]
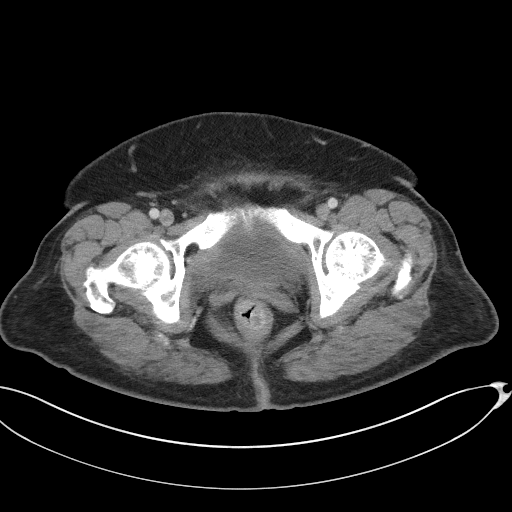
[im 30/108  soft-tissue]
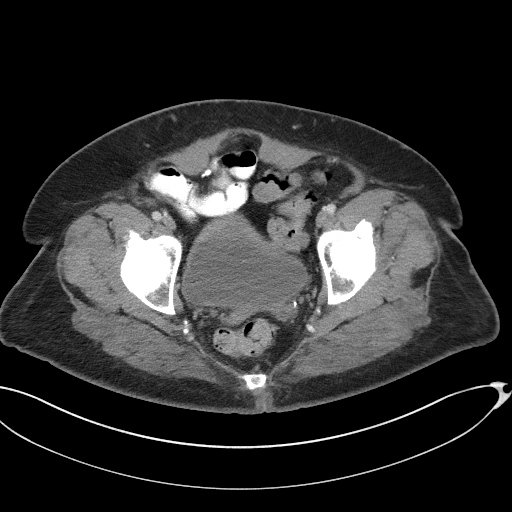
[im 36/108  soft-tissue]
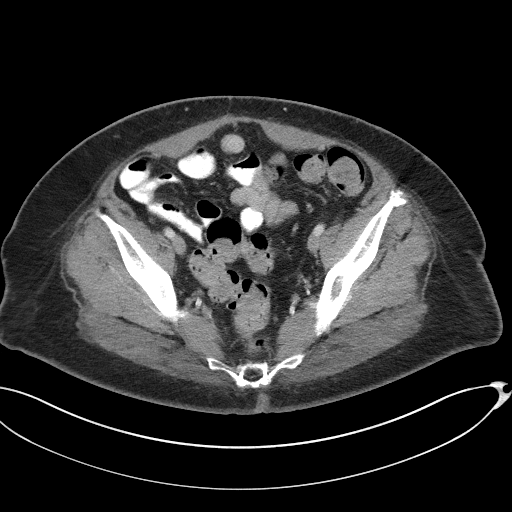
[im 48/108  soft-tissue]
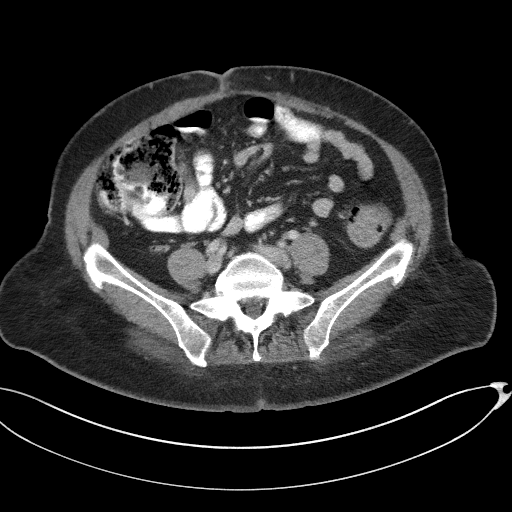
[im 54/108  soft-tissue]
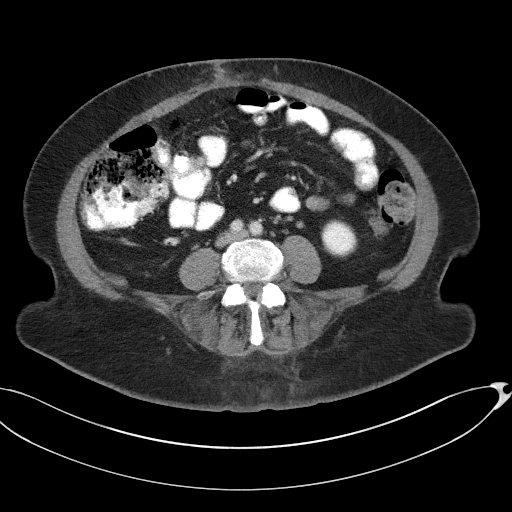
[im 60/108  soft-tissue]
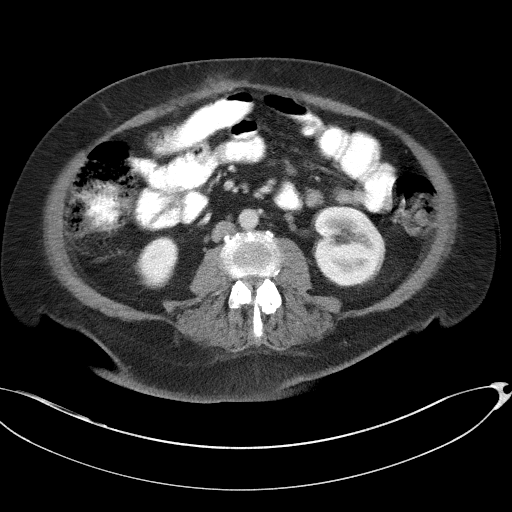
[im 72/108  soft-tissue]
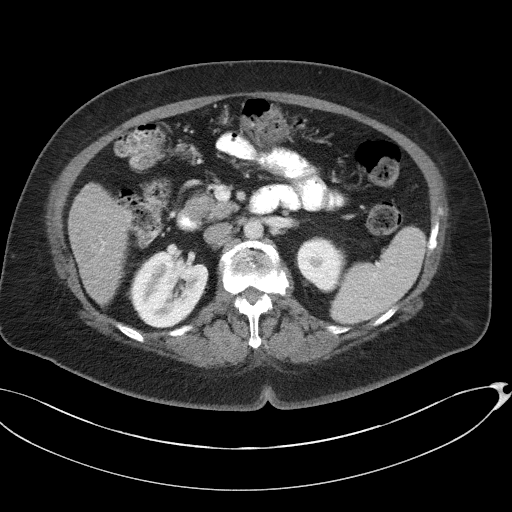
[im 72/108  bone]
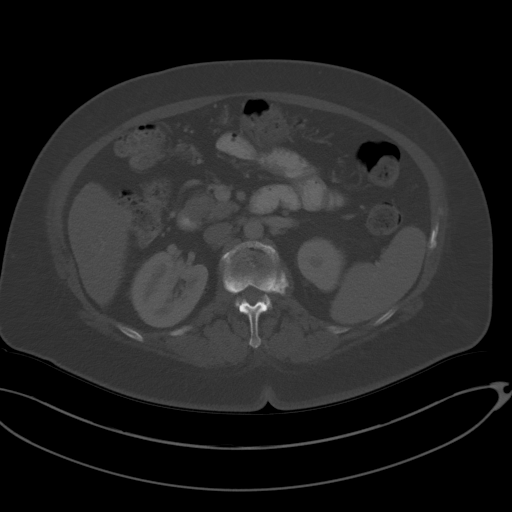
[im 78/108  soft-tissue]
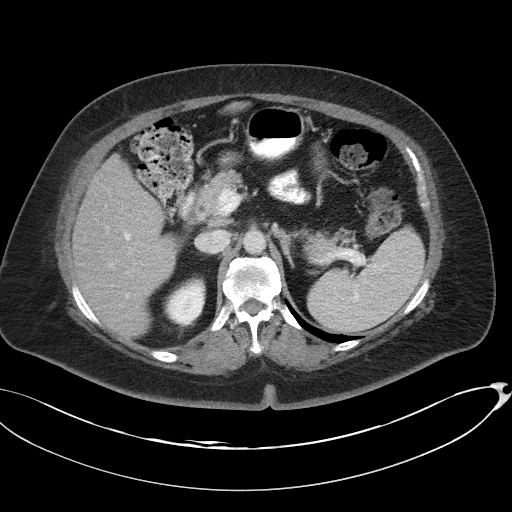
[im 84/108  soft-tissue]
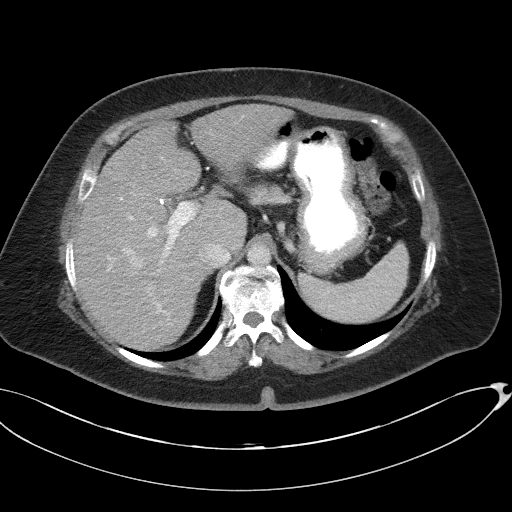
[im 96/108  soft-tissue]
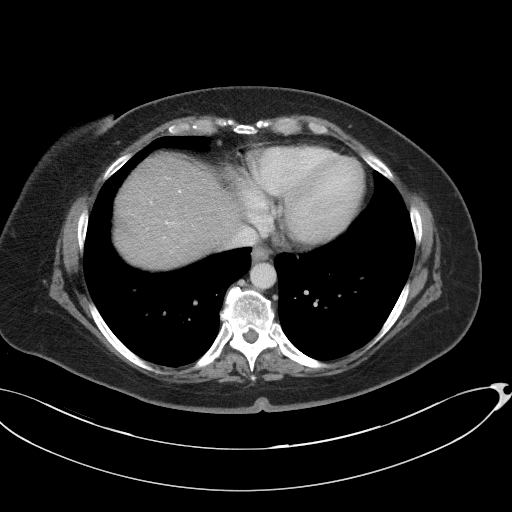
[im 102/108  soft-tissue]
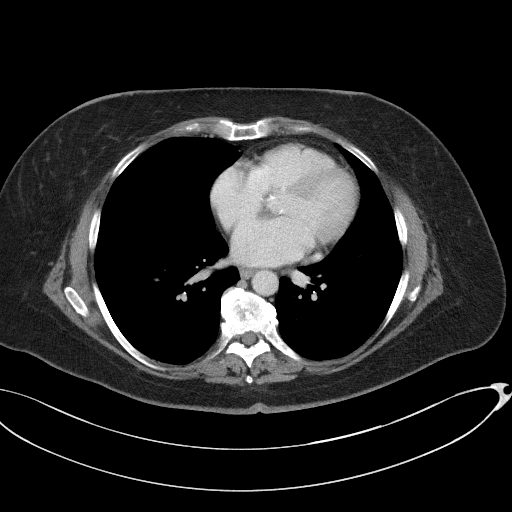

[Series 4: coronal st · coronal · 0.84mm/px · 3 of 102 slices shown]
[im 34/102  soft-tissue]
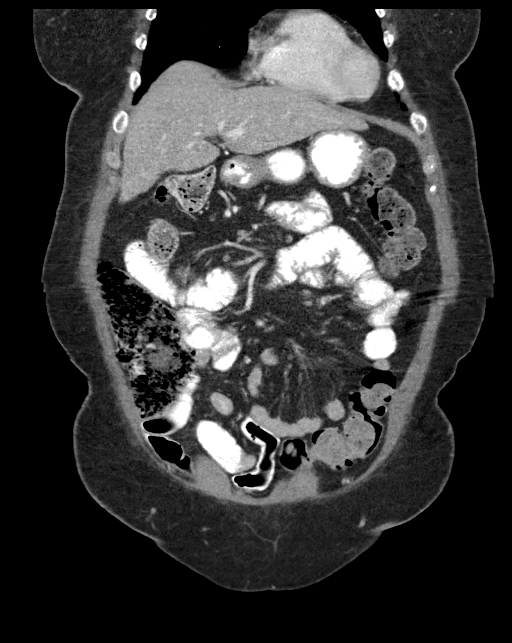
[im 45/102  soft-tissue]
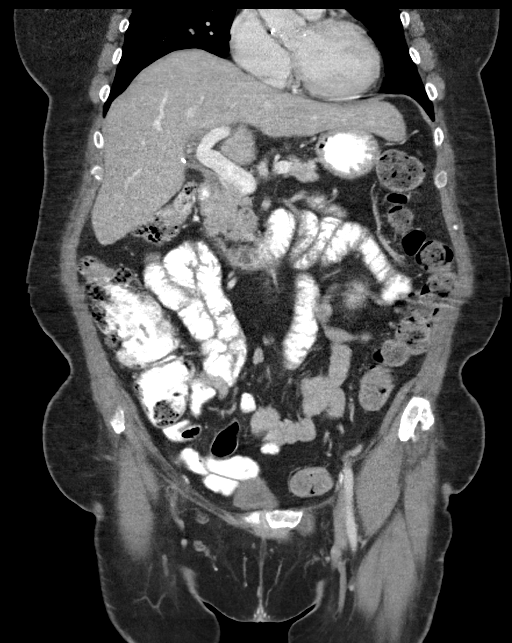
[im 57/102  soft-tissue]
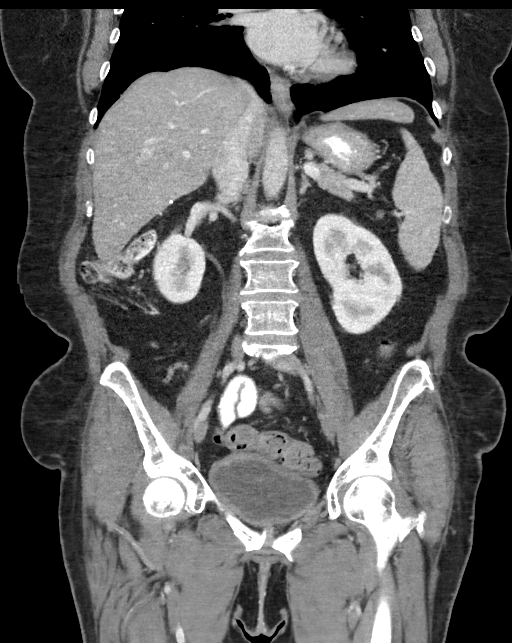

[16 of 46 positions shown; findings below may reference images not displayed]

FINDINGS: Lower chest: No acute abnormality

Hepatobiliary: No hepatic abnormalities are identified. The patient
is status post cholecystectomy. No biliary dilatation.

Pancreas: Unremarkable

Spleen: Unremarkable

Adrenals/Urinary Tract: The kidneys, adrenal glands and bladder are
unremarkable.

Stomach/Bowel: Stomach is within normal limits. Appendix appears
normal. No evidence of bowel wall thickening, distention, or
inflammatory changes. A moderate to large amount of colonic stool is
noted.

Vascular/Lymphatic: No significant vascular findings are present. No
enlarged abdominal or pelvic lymph nodes.

Reproductive: Status post hysterectomy. No adnexal masses.

Other: A small supraumbilical midline ventral hernia containing fat
is identified. No ascites, focal collection or pneumoperitoneum.

Musculoskeletal: No acute or suspicious bony abnormalities. Mild
multilevel degenerative disc disease noted in the LOWER thoracic and
UPPER lumbar spine.
IMPRESSION: 1. No evidence of acute abnormality.
2. Moderate to large amount of stool throughout the colon which can
be seen with constipation-correlate clinically.
3. Small supraumbilical midline ventral hernia containing fat.

## 2024-03-06 DEATH — deceased
